# Patient Record
Sex: Female | Born: 1965
Health system: Southern US, Community
[De-identification: ages and names within clinical notes are randomized; demographics above are authoritative.]

## PROBLEM LIST (undated history)

## (undated) DIAGNOSIS — F39 Unspecified mood [affective] disorder: Secondary | ICD-10-CM

## (undated) DIAGNOSIS — E785 Hyperlipidemia, unspecified: Secondary | ICD-10-CM

## (undated) DIAGNOSIS — N189 Chronic kidney disease, unspecified: Secondary | ICD-10-CM

## (undated) DIAGNOSIS — E079 Disorder of thyroid, unspecified: Secondary | ICD-10-CM

## (undated) HISTORY — DX: Chronic kidney disease, unspecified: N18.9

## (undated) HISTORY — DX: Hyperlipidemia, unspecified: E78.5

---

## 2001-09-23 ENCOUNTER — Emergency Department (HOSPITAL_COMMUNITY): Admission: EM | Admit: 2001-09-23 | Discharge: 2001-09-23 | Payer: Self-pay | Admitting: *Deleted

## 2001-09-23 ENCOUNTER — Encounter: Payer: Self-pay | Admitting: *Deleted

## 2011-04-15 ENCOUNTER — Emergency Department (HOSPITAL_COMMUNITY)
Admission: EM | Admit: 2011-04-15 | Discharge: 2011-04-15 | Disposition: A | Discharge status: Home / Self Care | Payer: BC Managed Care – PPO | Attending: Emergency Medicine | Admitting: Emergency Medicine

## 2011-04-15 DIAGNOSIS — E860 Dehydration: Secondary | ICD-10-CM | POA: Insufficient documentation

## 2011-04-15 DIAGNOSIS — E119 Type 2 diabetes mellitus without complications: Secondary | ICD-10-CM | POA: Insufficient documentation

## 2011-04-15 DIAGNOSIS — Z794 Long term (current) use of insulin: Secondary | ICD-10-CM | POA: Insufficient documentation

## 2011-04-15 LAB — CBC
HCT: 35.9 % — ABNORMAL LOW (ref 36.0–46.0)
Hemoglobin: 11.9 g/dL — ABNORMAL LOW (ref 12.0–15.0)
MCH: 26.8 pg (ref 26.0–34.0)
MCHC: 33.1 g/dL (ref 30.0–36.0)
MCV: 80.9 fL (ref 78.0–100.0)
Platelets: 244 10*3/uL (ref 150–400)
RBC: 4.44 MIL/uL (ref 3.87–5.11)
RDW: 13.8 % (ref 11.5–15.5)
WBC: 5.7 10*3/uL (ref 4.0–10.5)

## 2011-04-15 LAB — DIFFERENTIAL
Basophils Absolute: 0 10*3/uL (ref 0.0–0.1)
Basophils Relative: 0 % (ref 0–1)
Eosinophils Absolute: 0 10*3/uL (ref 0.0–0.7)
Eosinophils Relative: 0 % (ref 0–5)
Lymphocytes Relative: 18 % (ref 12–46)
Lymphs Abs: 1 10*3/uL (ref 0.7–4.0)
Monocytes Absolute: 0.3 10*3/uL (ref 0.1–1.0)
Monocytes Relative: 5 % (ref 3–12)
Neutro Abs: 4.3 10*3/uL (ref 1.7–7.7)
Neutrophils Relative %: 76 % (ref 43–77)

## 2011-04-15 LAB — COMPREHENSIVE METABOLIC PANEL
ALT: 16 U/L (ref 0–35)
AST: 15 U/L (ref 0–37)
Albumin: 3.2 g/dL — ABNORMAL LOW (ref 3.5–5.2)
Alkaline Phosphatase: 89 U/L (ref 39–117)
BUN: 14 mg/dL (ref 6–23)
CO2: 25 mEq/L (ref 19–32)
Calcium: 9.5 mg/dL (ref 8.4–10.5)
Chloride: 106 mEq/L (ref 96–112)
Creatinine, Ser: 0.71 mg/dL (ref 0.50–1.10)
GFR calc Af Amer: >60 mL/min (ref >60)
GFR calc non Af Amer: >60 mL/min (ref >60)
Glucose, Bld: 288 mg/dL — ABNORMAL HIGH (ref 70–99)
Potassium: 4.1 mEq/L (ref 3.5–5.1)
Sodium: 138 mEq/L (ref 135–145)
Total Bilirubin: 0.4 mg/dL (ref 0.3–1.2)
Total Protein: 6.2 g/dL (ref 6.0–8.3)

## 2011-04-15 LAB — GLUCOSE, CAPILLARY: Glucose-Capillary: 298 mg/dL — ABNORMAL HIGH (ref 70–99)

## 2012-07-27 ENCOUNTER — Other Ambulatory Visit (HOSPITAL_COMMUNITY)
Admission: RE | Admit: 2012-07-27 | Discharge: 2012-07-27 | Disposition: A | Discharge status: Home / Self Care | Payer: BC Managed Care – PPO | Source: Ambulatory Visit | Attending: Family Medicine | Admitting: Family Medicine

## 2012-07-27 ENCOUNTER — Other Ambulatory Visit: Payer: Self-pay | Admitting: Family Medicine

## 2012-07-27 DIAGNOSIS — Z1151 Encounter for screening for human papillomavirus (HPV): Secondary | ICD-10-CM | POA: Insufficient documentation

## 2012-07-27 DIAGNOSIS — Z124 Encounter for screening for malignant neoplasm of cervix: Secondary | ICD-10-CM | POA: Insufficient documentation

## 2012-07-29 ENCOUNTER — Emergency Department (HOSPITAL_COMMUNITY): Payer: No Typology Code available for payment source

## 2012-07-29 ENCOUNTER — Encounter (HOSPITAL_COMMUNITY): Payer: Self-pay

## 2012-07-29 ENCOUNTER — Emergency Department (HOSPITAL_COMMUNITY)
Admission: EM | Admit: 2012-07-29 | Discharge: 2012-07-29 | Disposition: A | Discharge status: Home / Self Care | Payer: No Typology Code available for payment source | Attending: Emergency Medicine | Admitting: Emergency Medicine

## 2012-07-29 DIAGNOSIS — Z79899 Other long term (current) drug therapy: Secondary | ICD-10-CM | POA: Insufficient documentation

## 2012-07-29 DIAGNOSIS — S9030XA Contusion of unspecified foot, initial encounter: Secondary | ICD-10-CM | POA: Insufficient documentation

## 2012-07-29 DIAGNOSIS — IMO0002 Reserved for concepts with insufficient information to code with codable children: Secondary | ICD-10-CM | POA: Insufficient documentation

## 2012-07-29 DIAGNOSIS — S9032XA Contusion of left foot, initial encounter: Secondary | ICD-10-CM

## 2012-07-29 DIAGNOSIS — E119 Type 2 diabetes mellitus without complications: Secondary | ICD-10-CM | POA: Insufficient documentation

## 2012-07-29 MED ORDER — HYDROCODONE-ACETAMINOPHEN 5-325 MG PO TABS: ORAL_TABLET | ORAL | Status: DC
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
## 2012-07-29 NOTE — ED Provider Notes (Signed)
Medical screening examination/treatment/procedure(s) were performed by non-physician practitioner and as supervising physician I was immediately available for consultation/collaboration.   Benny Lennert, MD 07/29/12 [phone]
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
## 2012-09-23 ENCOUNTER — Emergency Department (HOSPITAL_COMMUNITY)
Admission: EM | Admit: 2012-09-23 | Discharge: 2012-09-23 | Disposition: A | Discharge status: Home / Self Care | Payer: BC Managed Care – PPO | Attending: Emergency Medicine | Admitting: Emergency Medicine

## 2012-09-23 ENCOUNTER — Encounter (HOSPITAL_COMMUNITY): Payer: Self-pay | Admitting: Emergency Medicine

## 2012-09-23 DIAGNOSIS — K5289 Other specified noninfective gastroenteritis and colitis: Secondary | ICD-10-CM | POA: Insufficient documentation

## 2012-09-23 DIAGNOSIS — Z794 Long term (current) use of insulin: Secondary | ICD-10-CM | POA: Insufficient documentation

## 2012-09-23 DIAGNOSIS — E119 Type 2 diabetes mellitus without complications: Secondary | ICD-10-CM | POA: Insufficient documentation

## 2012-09-23 DIAGNOSIS — Z79899 Other long term (current) drug therapy: Secondary | ICD-10-CM | POA: Insufficient documentation

## 2012-09-23 DIAGNOSIS — R197 Diarrhea, unspecified: Secondary | ICD-10-CM | POA: Insufficient documentation

## 2012-09-23 DIAGNOSIS — K529 Noninfective gastroenteritis and colitis, unspecified: Secondary | ICD-10-CM

## 2012-09-23 LAB — GLUCOSE, CAPILLARY: Glucose-Capillary: 187 mg/dL — ABNORMAL HIGH (ref 70–99)

## 2012-09-23 MED ORDER — SODIUM CHLORIDE 0.9 % IV BOLUS (SEPSIS)
1000.0000 mL | Freq: Once | INTRAVENOUS | Status: AC
  Administered 2012-09-23: 1000 mL via INTRAVENOUS

## 2012-09-23 MED ORDER — ONDANSETRON 4 MG PO TBDP: 4.0000 mg | ORAL_TABLET | Freq: Three times a day (TID) | ORAL | Status: DC | PRN

## 2012-09-23 MED ORDER — PROMETHAZINE HCL 25 MG RE SUPP: 25.0000 mg | Freq: Four times a day (QID) | RECTAL | Status: DC | PRN

## 2012-09-23 MED ORDER — ONDANSETRON HCL 4 MG/2ML IJ SOLN
4.0000 mg | Freq: Once | INTRAMUSCULAR | Status: AC
  Administered 2012-09-23: 4 mg via INTRAVENOUS
  Filled 2012-09-23: qty 2

## 2012-09-23 NOTE — ED Notes (Signed)
Patient c/o nausea, vomiting, diarrhea and abdominal cramping x 3 hours.

## 2012-09-23 NOTE — ED Provider Notes (Signed)
History     CSN: [id number]  Arrival date & time 09/23/12  0117   First MD Initiated Contact with Patient 09/23/12 0216      Chief Complaint  Patient presents with  . Nausea  . Emesis  . Diarrhea    (Consider location/radiation/quality/duration/timing/severity/associated sxs/prior treatment) HPI Comments: 46 -year-old female who presents with a complaint of nausea vomiting and diarrhea. She states that her symptoms started this evening, she has had 4 episodes of emesis and 8 episodes of watery nonbloody diarrhea. Her spouse had similar symptoms and I treated him 2 days ago in the emergency department, the symptoms are the same, moderate to severe, nothing makes better or worse. She denies fevers, chills, cough, shortness of breath, chest pain, abdominal pain, back pain.  Patient is a 46 y.o. female presenting with vomiting and diarrhea. The history is provided by the patient.  Emesis  Associated symptoms include diarrhea.  Diarrhea The primary symptoms include vomiting and diarrhea.    Past Medical History  Diagnosis Date  . Diabetes mellitus     Past Surgical History  Procedure Date  . Cesarean section     x2    No family history on file.  History  Substance Use Topics  . Smoking status: Never Smoker   . Smokeless tobacco: Not on file  . Alcohol Use: No    OB History    Grav Para Term Preterm Abortions TAB SAB Ect Mult Living                  Review of Systems  Gastrointestinal: Positive for vomiting and diarrhea.  All other systems reviewed and are negative.    Allergies  Review of patient's allergies indicates no known allergies.  Home Medications   Current Outpatient Rx  Name  Route  Sig  Dispense  Refill  . ATORVASTATIN CALCIUM PO   Oral   Take 1 tablet by mouth daily.         Marland Kitchen CANAGLIFLOZIN 100 MG PO TABS   Oral   Take 3 tablets by mouth daily.          . INSULIN GLARGINE 100 UNIT/ML [place] SOLN   Subcutaneous   Inject 45 Units into  the skin at bedtime.          . TRIAMTERENE-HCTZ 37.5-25 MG PO TABS   Oral   Take 1 tablet by mouth daily as needed. For swelling         . HYDROCODONE-ACETAMINOPHEN 5-325 MG PO TABS      1 or 2 po q4h prn pain   15 tablet   0   . ONDANSETRON 4 MG PO TBDP   Oral   Take 1 tablet (4 mg total) by mouth every 8 (eight) hours as needed for nausea.   10 tablet   0   . PROMETHAZINE HCL 25 MG RE SUPP   Rectal   Place 1 suppository (25 mg total) rectally every 6 (six) hours as needed for nausea.   12 each   0   . SAXAGLIPTIN-METFORMIN ER 02-999 MG PO TB24   Oral   Take 1 tablet by mouth daily.           BP 103/54  Pulse 113  Temp 99.8 F (37.7 C) (Oral)  Resp 18  Ht 5\' 7"  (1.702 m)  Wt 230 lb (104.327 kg)  BMI 36.02 kg/m2  SpO2 96%  Physical Exam  Nursing note and vitals reviewed. Constitutional: She appears well-developed and  well-nourished. No distress.  HENT:  Head: Normocephalic and atraumatic.  Mouth/Throat: Oropharynx is clear and moist. No oropharyngeal exudate.  Eyes: Conjunctivae normal and EOM are normal. Pupils are equal, round, and reactive to light. Right eye exhibits no discharge. Left eye exhibits no discharge. No scleral icterus.  Neck: Normal range of motion. Neck supple. No JVD present. No thyromegaly present.  Cardiovascular: Regular rhythm, normal heart sounds and intact distal pulses.  Exam reveals no gallop and no friction rub.   No murmur heard.      Mild tachycardia  Pulmonary/Chest: Effort normal and breath sounds normal. No respiratory distress. She has no wheezes. She has no rales.  Abdominal: Soft. She exhibits no distension and no mass. There is no tenderness.       Increased bowel sounds, nontender abdomen, obese  Musculoskeletal: Normal range of motion. She exhibits no edema and no tenderness.  Lymphadenopathy:    She has no cervical adenopathy.  Neurological: She is alert. Coordination normal.  Skin: Skin is warm and dry. No rash  noted. No erythema.  Psychiatric: She has a normal mood and affect. Her behavior is normal.    ED Course  Procedures (including critical care time)  Labs Reviewed  GLUCOSE, CAPILLARY - Abnormal; Notable for the following:    Glucose-Capillary 187 (*)     All other components within normal limits   No results found.   1. Gastroenteritis       MDM  The patient does appear to be mildly dehydrated, has a mild tachycardia, low-grade fever at 99.8. She has a spouse with similar symptoms over the last week thus I suspect that she has the related infection. She has opted for IV fluid rehydration and intravenous parenteral antiemetics. I feel this is appropriate therapy at this time, we'll check a CBG, patient appears stable overall without hypotension, hypoxia or any respiratory distress.  Symptoms significantly improved after 2 L of IV fluids and Zofran, patient stable for discharge.     Vida Roller, MD 09/23/12 [phone]

## 2013-09-06 ENCOUNTER — Encounter (HOSPITAL_COMMUNITY): Payer: Self-pay | Admitting: Emergency Medicine

## 2013-09-06 ENCOUNTER — Emergency Department (HOSPITAL_COMMUNITY)
Admission: EM | Admit: 2013-09-06 | Discharge: 2013-09-06 | Disposition: A | Discharge status: Home / Self Care | Payer: BC Managed Care – PPO | Attending: Emergency Medicine | Admitting: Emergency Medicine

## 2013-09-06 DIAGNOSIS — Z8659 Personal history of other mental and behavioral disorders: Secondary | ICD-10-CM | POA: Insufficient documentation

## 2013-09-06 DIAGNOSIS — E1169 Type 2 diabetes mellitus with other specified complication: Secondary | ICD-10-CM | POA: Insufficient documentation

## 2013-09-06 DIAGNOSIS — E162 Hypoglycemia, unspecified: Secondary | ICD-10-CM

## 2013-09-06 DIAGNOSIS — Z794 Long term (current) use of insulin: Secondary | ICD-10-CM | POA: Insufficient documentation

## 2013-09-06 DIAGNOSIS — Z79899 Other long term (current) drug therapy: Secondary | ICD-10-CM | POA: Insufficient documentation

## 2013-09-06 DIAGNOSIS — E876 Hypokalemia: Secondary | ICD-10-CM | POA: Insufficient documentation

## 2013-09-06 DIAGNOSIS — E079 Disorder of thyroid, unspecified: Secondary | ICD-10-CM | POA: Insufficient documentation

## 2013-09-06 HISTORY — DX: Unspecified mood (affective) disorder: F39

## 2013-09-06 HISTORY — DX: Disorder of thyroid, unspecified: E07.9

## 2013-09-06 LAB — URINALYSIS, ROUTINE W REFLEX MICROSCOPIC
Bilirubin Urine: NEGATIVE
Ketones, ur: NEGATIVE mg/dL
Nitrite: NEGATIVE
Protein, ur: NEGATIVE mg/dL
Specific Gravity, Urine: >1.03 — ABNORMAL HIGH (ref 1.005–1.030)
Urobilinogen, UA: 0.2 mg/dL (ref 0.0–1.0)

## 2013-09-06 LAB — CBC WITH DIFFERENTIAL/PLATELET
Basophils Absolute: 0 10*3/uL (ref 0.0–0.1)
Basophils Relative: 0 % (ref 0–1)
Eosinophils Absolute: 0.1 10*3/uL (ref 0.0–0.7)
MCHC: 32.7 g/dL (ref 30.0–36.0)
Monocytes Absolute: 0.5 10*3/uL (ref 0.1–1.0)
Neutro Abs: 5.1 10*3/uL (ref 1.7–7.7)
Neutrophils Relative %: 68 % (ref 43–77)
RDW: 13.5 % (ref 11.5–15.5)

## 2013-09-06 LAB — BASIC METABOLIC PANEL
BUN: 23 mg/dL (ref 6–23)
Creatinine, Ser: 1.24 mg/dL — ABNORMAL HIGH (ref 0.50–1.10)
GFR calc Af Amer: 59 mL/min — ABNORMAL LOW (ref >90)
GFR calc non Af Amer: 51 mL/min — ABNORMAL LOW (ref >90)

## 2013-09-06 LAB — URINE MICROSCOPIC-ADD ON

## 2013-09-06 LAB — GLUCOSE, CAPILLARY: Glucose-Capillary: 155 mg/dL — ABNORMAL HIGH (ref 70–99)

## 2013-09-06 MED ORDER — POTASSIUM CHLORIDE ER 10 MEQ PO TBCR: 10.0000 meq | EXTENDED_RELEASE_TABLET | Freq: Every day | ORAL | Status: DC

## 2013-09-06 MED ORDER — SODIUM CHLORIDE 0.9 % IV SOLN
Freq: Once | INTRAVENOUS | Status: AC
  Administered 2013-09-06: 20 mL/h via INTRAVENOUS

## 2013-09-06 NOTE — ED Notes (Signed)
Patient presents to ER via [facility] with c/o hypoglycemia.  Per EMS, CBG was 32 and patient was unresponsive.  EMS gave 1 amp D50 and CBG was 164 en route.

## 2013-09-06 NOTE — ED Provider Notes (Signed)
CSN: [id number]     Arrival date & time 09/06/13  0306 History   First MD Initiated Contact with Patient 09/06/13 0309     Chief Complaint  Patient presents with  . Hypoglycemia   (Consider location/radiation/quality/duration/timing/severity/associated sxs/prior Treatment) HPI Comments: Pt is a diabetic with hx of using oral and SQ meds - took her 70/30 last night (32 units) and went to bed - she had no c/o last night and has been eating normal meals without vomiting, nausea, pain, dysuria, diarrhea.  She was found by family unresponsive at home - EMS was called and found her CBG to be around 30 - D50 was given and her MS improved with her CBG.  She is normal MS at arrival.  She again states that nothing bother her at this time - no c/o.  Patient is a 47 y.o. female presenting with hypoglycemia. The history is provided by the patient and the EMS personnel.  Hypoglycemia   Past Medical History  Diagnosis Date  . Diabetes mellitus   . Thyroid disease   . Mood disorder    Past Surgical History  Procedure Laterality Date  . Cesarean section      x2   No family history on file. History  Substance Use Topics  . Smoking status: Never Smoker   . Smokeless tobacco: Not on file  . Alcohol Use: No   OB History   Grav Para Term Preterm Abortions TAB SAB Ect Mult Living                 Review of Systems  All other systems reviewed and are negative.    Allergies  Review of patient's allergies indicates no known allergies.  Home Medications   Current Outpatient Rx  Name  Route  Sig  Dispense  Refill  . ATORVASTATIN CALCIUM PO   Oral   Take 1 tablet by mouth daily.         Marland Kitchen buPROPion (WELLBUTRIN XL) 150 MG 24 hr tablet   Oral   Take 150 mg by mouth daily.         . Canagliflozin (INVOKANA) 100 MG TABS   Oral   Take 1 tablet by mouth daily.          . furosemide (LASIX) 20 MG tablet   Oral   Take 20 mg by mouth daily.         . insulin aspart protamine-  aspart (NOVOLOG MIX 70/30) (70-30) 100 UNIT/ML injection   Subcutaneous   Inject 26 Units into the skin 2 (two) times daily with a meal.         . levothyroxine (SYNTHROID, LEVOTHROID) 50 MCG tablet   Oral   Take 50 mcg by mouth daily before breakfast.         . Liraglutide (VICTOZA) 18 MG/3ML SOPN   Subcutaneous   Inject 1.8 mg into the skin daily.         Marland Kitchen triamterene-hydrochlorothiazide (MAXZIDE-25) 37.5-25 MG per tablet   Oral   Take 1 tablet by mouth daily as needed. For swelling         . HYDROcodone-acetaminophen (NORCO) 5-325 MG per tablet      1 or 2 po q4h prn pain   15 tablet   0   . insulin glargine (LANTUS) 100 UNIT/ML injection   Subcutaneous   Inject 45 Units into the skin at bedtime.          . ondansetron (ZOFRAN ODT) 4 MG  disintegrating tablet   Oral   Take 1 tablet (4 mg total) by mouth every 8 (eight) hours as needed for nausea.   10 tablet   0   . potassium chloride (K-DUR) 10 MEQ tablet   Oral   Take 1 tablet (10 mEq total) by mouth daily.   10 tablet   0   . promethazine (PHENERGAN) 25 MG suppository   Rectal   Place 1 suppository (25 mg total) rectally every 6 (six) hours as needed for nausea.   12 each   0   . Saxagliptin-Metformin (KOMBIGLYZE XR) 02-999 MG TB24   Oral   Take 1 tablet by mouth daily.          BP 121/66  Pulse 88  Temp(Src) 98.1 F (36.7 C) (Oral)  Resp 14  Ht 5\' 7"  (1.702 m)  Wt 236 lb (107.049 kg)  BMI 36.95 kg/m2  SpO2 99% Physical Exam  Nursing note and vitals reviewed. Constitutional: She appears well-developed and well-nourished. No distress.  HENT:  Head: Normocephalic and atraumatic.  Mouth/Throat: Oropharynx is clear and moist. No oropharyngeal exudate.  Eyes: Conjunctivae and EOM are normal. Pupils are equal, round, and reactive to light. Right eye exhibits no discharge. Left eye exhibits no discharge. No scleral icterus.  Neck: Normal range of motion. Neck supple. No JVD present. No  thyromegaly present.  Cardiovascular: Normal rate, regular rhythm, normal heart sounds and intact distal pulses.  Exam reveals no gallop and no friction rub.   No murmur heard. Pulmonary/Chest: Effort normal and breath sounds normal. No respiratory distress. She has no wheezes. She has no rales.  Abdominal: Soft. Bowel sounds are normal. She exhibits no distension and no mass. There is no tenderness.  Musculoskeletal: Normal range of motion. She exhibits no edema and no tenderness.  Lymphadenopathy:    She has no cervical adenopathy.  Neurological: She is alert. Coordination normal.  Normal speech, coordination, strength, CN 3-12 and memory  Skin: Skin is warm and dry. No rash noted. No erythema.  Psychiatric: She has a normal mood and affect. Her behavior is normal.    ED Course  Procedures (including critical care time) Labs Review Labs Reviewed  URINALYSIS, ROUTINE W REFLEX MICROSCOPIC - Abnormal; Notable for the following:    Specific Gravity, Urine >1.030 (*)    Glucose, UA >1000 (*)    All other components within normal limits  BASIC METABOLIC PANEL - Abnormal; Notable for the following:    Potassium 3.0 (*)    Glucose, Bld 64 (*)    Creatinine, Ser 1.24 (*)    GFR calc non Af Amer 51 (*)    GFR calc Af Amer 59 (*)    All other components within normal limits  GLUCOSE, CAPILLARY - Abnormal; Notable for the following:    Glucose-Capillary 125 (*)    All other components within normal limits  URINE MICROSCOPIC-ADD ON - Abnormal; Notable for the following:    Squamous Epithelial / LPF MANY (*)    All other components within normal limits  GLUCOSE, CAPILLARY - Abnormal; Notable for the following:    Glucose-Capillary 155 (*)    All other components within normal limits  GLUCOSE, CAPILLARY - Abnormal; Notable for the following:    Glucose-Capillary 131 (*)    All other components within normal limits  GLUCOSE, CAPILLARY  CBC WITH DIFFERENTIAL   Imaging Review No results  found.  EKG Interpretation   None       MDM  1. Hypoglycemia   2. Hypokalemia      The pt is well appearing, VS are normal, feed, recheck CBG, basic labs.  Pt has beeen well since arrival - has not had recurrent drop in cbg after eating - no abnormal findings on labs other than hypokalemia.  Meds given in ED:  Medications  0.9 %  sodium chloride infusion (20 mL/hr Intravenous New Bag/Given 09/06/13 0357)    New Prescriptions   POTASSIUM CHLORIDE (K-DUR) 10 MEQ TABLET    Take 1 tablet (10 mEq total) by mouth daily.      Vida Roller, MD 09/06/13 [phone]

## 2013-09-06 NOTE — ED Notes (Signed)
Ambulatory to bathroom for BM- states her stomach is rolling.  Has had 2 TBS of peanut butter, crackers and the [facility] gave her a slice of birthday cake

## 2013-09-11 ENCOUNTER — Emergency Department (HOSPITAL_BASED_OUTPATIENT_CLINIC_OR_DEPARTMENT_OTHER)
Admission: EM | Admit: 2013-09-11 | Discharge: 2013-09-11 | Disposition: A | Discharge status: Home / Self Care | Payer: BC Managed Care – PPO | Attending: Emergency Medicine | Admitting: Emergency Medicine

## 2013-09-11 ENCOUNTER — Encounter (HOSPITAL_BASED_OUTPATIENT_CLINIC_OR_DEPARTMENT_OTHER): Payer: Self-pay | Admitting: Emergency Medicine

## 2013-09-11 DIAGNOSIS — Z79899 Other long term (current) drug therapy: Secondary | ICD-10-CM | POA: Insufficient documentation

## 2013-09-11 DIAGNOSIS — E079 Disorder of thyroid, unspecified: Secondary | ICD-10-CM | POA: Insufficient documentation

## 2013-09-11 DIAGNOSIS — Z794 Long term (current) use of insulin: Secondary | ICD-10-CM | POA: Insufficient documentation

## 2013-09-11 DIAGNOSIS — E1169 Type 2 diabetes mellitus with other specified complication: Secondary | ICD-10-CM | POA: Insufficient documentation

## 2013-09-11 DIAGNOSIS — Z8659 Personal history of other mental and behavioral disorders: Secondary | ICD-10-CM | POA: Insufficient documentation

## 2013-09-11 DIAGNOSIS — E162 Hypoglycemia, unspecified: Secondary | ICD-10-CM

## 2013-09-11 LAB — GLUCOSE, CAPILLARY: Glucose-Capillary: 99 mg/dL (ref 70–99)

## 2013-09-11 NOTE — ED Provider Notes (Signed)
CSN: [id number]     Arrival date & time 09/11/13  1122 History   First MD Initiated Contact with Patient 09/11/13 1208     Chief Complaint  Patient presents with  . Hypoglycemia   (Consider location/radiation/quality/duration/timing/severity/associated sxs/prior Treatment) Patient is a 47 y.o. female presenting with hypoglycemia.  Hypoglycemia Associated symptoms: no shortness of breath     47 year old female here after hypoglycemic episode this morning. She describes that she went to work as usual, took a reduced dose of her insulin (26 units) and went back to work is normal. 30-60 minutes later she began having scotoma, sweats, shaking, chills, and CBG which was found to be 75 at that time. She had some M&Ms and rechecked her blood sugar 15 minutes later which was stable at 75. States that she became very emotional and so she was sent home by her boss. She checked again and it was 63. Her husband called her doctor's office who advised her to come straight to the ER. She reports normal PO intake lately and is tolerating fluids normally.   Explains that one week ago he was found unresponsive and had a CBG of 30. She's been on this dose of insulin and invokana for approximately 6 weeks. She cut her 70/30 dose from 36 BID to 26 units am and 18 units evening.   Past Medical History  Diagnosis Date  . Diabetes mellitus   . Thyroid disease   . Mood disorder    Past Surgical History  Procedure Laterality Date  . Cesarean section      x2   No family history on file. History  Substance Use Topics  . Smoking status: Never Smoker   . Smokeless tobacco: Not on file  . Alcohol Use: No   OB History   Grav Para Term Preterm Abortions TAB SAB Ect Mult Living                 Review of Systems  Constitutional: Negative for fever, chills and appetite change.  HENT: Negative for sore throat.   Respiratory: Negative for cough and shortness of breath.   Cardiovascular: Negative for chest pain.   Gastrointestinal: Negative for abdominal pain.  Genitourinary: Negative for dysuria.  Musculoskeletal: Negative for myalgias.  Neurological: Negative for headaches.  All other systems reviewed and are negative.    Allergies  Review of patient's allergies indicates no known allergies.  Home Medications   Current Outpatient Rx  Name  Route  Sig  Dispense  Refill  . ATORVASTATIN CALCIUM PO   Oral   Take 1 tablet by mouth daily.         Marland Kitchen buPROPion (WELLBUTRIN XL) 150 MG 24 hr tablet   Oral   Take 150 mg by mouth daily.         . Canagliflozin (INVOKANA) 100 MG TABS   Oral   Take 1 tablet by mouth daily.          . furosemide (LASIX) 20 MG tablet   Oral   Take 20 mg by mouth daily.         Marland Kitchen HYDROcodone-acetaminophen (NORCO) 5-325 MG per tablet      1 or 2 po q4h prn pain   15 tablet   0   . insulin aspart protamine- aspart (NOVOLOG MIX 70/30) (70-30) 100 UNIT/ML injection   Subcutaneous   Inject 26 Units into the skin 2 (two) times daily with a meal.         .  insulin glargine (LANTUS) 100 UNIT/ML injection   Subcutaneous   Inject 45 Units into the skin at bedtime.          Marland Kitchen levothyroxine (SYNTHROID, LEVOTHROID) 50 MCG tablet   Oral   Take 50 mcg by mouth daily before breakfast.         . Liraglutide (VICTOZA) 18 MG/3ML SOPN   Subcutaneous   Inject 1.8 mg into the skin daily.         . ondansetron (ZOFRAN ODT) 4 MG disintegrating tablet   Oral   Take 1 tablet (4 mg total) by mouth every 8 (eight) hours as needed for nausea.   10 tablet   0   . potassium chloride (K-DUR) 10 MEQ tablet   Oral   Take 1 tablet (10 mEq total) by mouth daily.   10 tablet   0   . promethazine (PHENERGAN) 25 MG suppository   Rectal   Place 1 suppository (25 mg total) rectally every 6 (six) hours as needed for nausea.   12 each   0   . Saxagliptin-Metformin (KOMBIGLYZE XR) 02-999 MG TB24   Oral   Take 1 tablet by mouth daily.         Marland Kitchen  triamterene-hydrochlorothiazide (MAXZIDE-25) 37.5-25 MG per tablet   Oral   Take 1 tablet by mouth daily as needed. For swelling          BP 114/79  Pulse 88  Temp(Src) 98 F (36.7 C) (Oral)  Resp 18  Ht 5\' 7"  (1.702 m)  Wt 222 lb (100.699 kg)  BMI 34.76 kg/m2  SpO2 100% Physical Exam  Nursing note and vitals reviewed. Constitutional: She is oriented to person, place, and time. She appears well-developed and well-nourished. No distress.  HENT:  Head: Normocephalic and atraumatic.  Eyes: EOM are normal. Pupils are equal, round, and reactive to light.  Neck: Neck supple.  Cardiovascular: Normal rate, regular rhythm and normal heart sounds.   No murmur heard. Pulmonary/Chest: Effort normal and breath sounds normal.  Abdominal: Soft. Bowel sounds are normal. There is no tenderness. There is no guarding.  Musculoskeletal: She exhibits no edema.  Neurological: She is alert and oriented to person, place, and time.  Skin: Skin is warm and dry. She is not diaphoretic.  Psychiatric: She has a normal mood and affect.    ED Course  Procedures (including critical care time) Labs Review Labs Reviewed  GLUCOSE, CAPILLARY   Imaging Review No results found.  EKG Interpretation   None       MDM   1. Hypoglycemia    47 y/o female here with mild hypoglycemic episode this am. She had an episode last week where she was found unresponsive and has been working closely with her endocrinologist since then to reduce her dose. She now feels well and her CBG is 99.   I discussed her case with Dr. Sharl Ma, her endocrinologist, Who recommended decreasing her 70/30 dose to 20 in the am and following up in the coming days.   Discussed red flags and that there may be an adjustment period with better glucose control where eventually 70s, low normal CBG, will likely npt feel like hypoglycemia.       Elenora Gamma, MD 09/11/13 1250

## 2013-09-11 NOTE — ED Provider Notes (Signed)
I saw and evaluated the patient, reviewed the resident's note and I agree with the findings and plan.  EKG Interpretation   None       Pt with borderline hypoglycemia at work, improved after eating. Advised by someone in Endo office to come to the ED. Asymptomatic now.   Charles B. Bernette Mayers, MD 09/11/13 [phone]

## 2013-09-11 NOTE — ED Notes (Signed)
EMS reports patient has diabetes and has had some recent low levels.  Has her medication adjusted yesterday by her PCP.  Two hour pta, pt felt shaky and checked her cbg and it was 70.  Ate and felt better, now cbg is 111.  States her PCP Dr. Napoleon Form told her to come to the ed today.

## 2014-09-30 ENCOUNTER — Emergency Department (HOSPITAL_BASED_OUTPATIENT_CLINIC_OR_DEPARTMENT_OTHER)
Admission: EM | Admit: 2014-09-30 | Discharge: 2014-10-01 | Disposition: A | Discharge status: Home / Self Care | Payer: BC Managed Care – PPO | Attending: Emergency Medicine | Admitting: Emergency Medicine

## 2014-09-30 ENCOUNTER — Emergency Department (HOSPITAL_BASED_OUTPATIENT_CLINIC_OR_DEPARTMENT_OTHER): Payer: BC Managed Care – PPO

## 2014-09-30 ENCOUNTER — Encounter (HOSPITAL_BASED_OUTPATIENT_CLINIC_OR_DEPARTMENT_OTHER): Payer: Self-pay | Admitting: Emergency Medicine

## 2014-09-30 DIAGNOSIS — Y998 Other external cause status: Secondary | ICD-10-CM | POA: Diagnosis not present

## 2014-09-30 DIAGNOSIS — M722 Plantar fascial fibromatosis: Secondary | ICD-10-CM | POA: Insufficient documentation

## 2014-09-30 DIAGNOSIS — X58XXXA Exposure to other specified factors, initial encounter: Secondary | ICD-10-CM | POA: Insufficient documentation

## 2014-09-30 DIAGNOSIS — S99921A Unspecified injury of right foot, initial encounter: Secondary | ICD-10-CM | POA: Diagnosis present

## 2014-09-30 DIAGNOSIS — Z8659 Personal history of other mental and behavioral disorders: Secondary | ICD-10-CM | POA: Insufficient documentation

## 2014-09-30 DIAGNOSIS — Y9389 Activity, other specified: Secondary | ICD-10-CM | POA: Diagnosis not present

## 2014-09-30 DIAGNOSIS — M79673 Pain in unspecified foot: Secondary | ICD-10-CM

## 2014-09-30 DIAGNOSIS — E079 Disorder of thyroid, unspecified: Secondary | ICD-10-CM | POA: Diagnosis not present

## 2014-09-30 DIAGNOSIS — Y9301 Activity, walking, marching and hiking: Secondary | ICD-10-CM | POA: Diagnosis not present

## 2014-09-30 DIAGNOSIS — Z794 Long term (current) use of insulin: Secondary | ICD-10-CM | POA: Diagnosis not present

## 2014-09-30 DIAGNOSIS — E119 Type 2 diabetes mellitus without complications: Secondary | ICD-10-CM | POA: Insufficient documentation

## 2014-09-30 DIAGNOSIS — S96911A Strain of unspecified muscle and tendon at ankle and foot level, right foot, initial encounter: Secondary | ICD-10-CM | POA: Insufficient documentation

## 2014-09-30 DIAGNOSIS — Y9289 Other specified places as the place of occurrence of the external cause: Secondary | ICD-10-CM | POA: Diagnosis not present

## 2014-09-30 DIAGNOSIS — Z79899 Other long term (current) drug therapy: Secondary | ICD-10-CM | POA: Diagnosis not present

## 2014-09-30 NOTE — ED Notes (Signed)
Pt states that she was walking and hurt her foot

## 2014-09-30 NOTE — Discharge Instructions (Signed)
Foot Sprain The muscles and cord like structures which attach muscle to bone (tendons) that surround the feet are made up of units. A foot sprain can occur at the weakest spot in any of these units. This condition is most often caused by injury to or overuse of the foot, as from playing contact sports, or aggravating a previous injury, or from poor conditioning, or obesity. SYMPTOMS  Pain with movement of the foot.  Tenderness and swelling at the injury site.  Loss of strength is present in moderate or severe sprains. THE THREE GRADES OR SEVERITY OF FOOT SPRAIN ARE:  Mild (Grade I): Slightly pulled muscle without tearing of muscle or tendon fibers or loss of strength.  Moderate (Grade II): Tearing of fibers in a muscle, tendon, or at the attachment to bone, with small decrease in strength.  Severe (Grade III): Rupture of the muscle-tendon-bone attachment, with separation of fibers. Severe sprain requires surgical repair. Often repeating (chronic) sprains are caused by overuse. Sudden (acute) sprains are caused by direct injury or over-use. DIAGNOSIS  Diagnosis of this condition is usually by your own observation. If problems continue, a caregiver may be required for further evaluation and treatment. X-rays may be required to make sure there are not breaks in the bones (fractures) present. Continued problems may require physical therapy for treatment. PREVENTION  Use strength and conditioning exercises appropriate for your sport.  Warm up properly prior to working out.  Use athletic shoes that are made for the sport you are participating in.  Allow adequate time for healing. Early return to activities makes repeat injury more likely, and can lead to an unstable arthritic foot that can result in prolonged disability. Mild sprains generally heal in 3 to 10 days, with moderate and severe sprains taking 2 to 10 weeks. Your caregiver can help you determine the proper time required for  healing. HOME CARE INSTRUCTIONS   Apply ice to the injury for 15-20 minutes, 03-04 times per day. Put the ice in a plastic bag and place a towel between the bag of ice and your skin.  An elastic wrap (like an Ace bandage) may be used to keep swelling down.  Keep foot above the level of the heart, or at least raised on a footstool, when swelling and pain are present.  Try to avoid use other than gentle range of motion while the foot is painful. Do not resume use until instructed by your caregiver. Then begin use gradually, not increasing use to the point of pain. If pain does develop, decrease use and continue the above measures, gradually increasing activities that do not cause discomfort, until you gradually achieve normal use.  Use crutches if and as instructed, and for the length of time instructed.  Keep injured foot and ankle wrapped between treatments.  Massage foot and ankle for comfort and to keep swelling down. Massage from the toes up towards the knee.  Only take over-the-counter or prescription medicines for pain, discomfort, or fever as directed by your caregiver. SEEK IMMEDIATE MEDICAL CARE IF:   Your pain and swelling increase, or pain is not controlled with medications.  You have loss of feeling in your foot or your foot turns cold or blue.  You develop new, unexplained symptoms, or an increase of the symptoms that brought you to your caregiver. MAKE SURE YOU:   Understand these instructions.  Will watch your condition.  Will get help right away if you are not doing well or get worse. Document Released:  04/02/2002 Document Revised: 01/03/2012 Document Reviewed: 05/30/2008 ExitCare Patient Information 2015 [place] [place]. This information is not intended to replace advice given to you by your health care provider. Make sure you discuss any questions you have with your health care provider.  Heel Spur A heel spur is a hook of bone that can form on the calcaneus (the  heel bone and the largest bone of the foot). Heel spurs are often associated with plantar fasciitis and usually come in people who have had the problem for an extended period of time. The cause of the relationship is unknown. The pain associated with them is thought to be caused by an inflammation (soreness and redness) of the plantar fascia rather than the spur itself. The plantar fascia is a thick fibrous like tissue that runs from the calcaneus (heel bone) to the ball of the foot. This strong, tight tissue helps maintain the arch of your foot. It helps distribute the weight across your foot as you walk or run. Stresses placed on the plantar fascia can be tremendous. When it is inflamed normal activities become painful. Pain is worse in the morning after sleeping. After sleeping the plantar fascia is tight. The first movements stretch the fascia and this causes pain. As the tendon loosens, the pain usually gets better. It often returns with too much standing or walking.  About 70% of patients with plantar fasciitis have a heel spur. About half of people without foot pain also have heel spurs. DIAGNOSIS  The diagnosis of a heel spur is made by X-ray. The X-ray shows a hook of bone protruding from the bottom of the calcaneus at the point where the plantar fascia is attached to the heel bone.  TREATMENT  It is necessary to find out what is causing the stretching of the plantar fascia. If the cause is over-pronation (flat feet), orthotics and proper foot ware may help.  Stretching exercises, losing weight, wearing shoes that have a cushioned heel that absorbs shock, and elevating the heel with the use of a heel cradle, heel cup, or orthotics may all help. Heel cradles and heel cups provide extra comfort and cushion to the heel, and reduce the amount of shock to the sore area. AVOIDING THE PAIN OF PLANTAR FASCIITIS AND HEEL SPURS  Consult a sports medicine professional before beginning a new exercise  program.  Walking programs offer a good workout. There is a lower chance of overuse injuries common to the runners. There is less impact and less jarring of the joints.  Begin all new exercise programs slowly. If problems or pains develop, decrease the amount of time or distance until you are at a comfortable level.  Wear good shoes and replace them regularly.  Stretch your foot and the heel cords at the back of the ankle (Achilles tendons) both before and after exercise.  Run or exercise on even surfaces that are not hard. For example, asphalt is better than pavement.  Do not run barefoot on hard surfaces.  If using a treadmill, vary the incline.  Do not continue to workout if you have foot or joint problems. Seek professional help if they do not improve. HOME CARE INSTRUCTIONS   Avoid activities that cause you pain until you recover.  Use ice or cold packs to the problem or painful areas after working out.  Only take over-the-counter or prescription medicines for pain, discomfort, or fever as directed by your caregiver.  Soft shoe inserts or athletic shoes with air or gel  sole cushions may be helpful.  If problems continue or become more severe, consult a sports medicine caregiver. Cortisone is a potent anti-inflammatory medication that may be injected into the painful area. You can discuss this treatment with your caregiver. MAKE SURE YOU:   Understand these instructions.  Will watch your condition.  Will get help right away if you are not doing well or get worse. Document Released: 11/17/2005 Document Revised: 01/03/2012 Document Reviewed: 12/12/2013 [facility] [place] Patient Information 2015 [place], [place]. This information is not intended to replace advice given to you by your health care provider. Make sure you discuss any questions you have with your health care provider.

## 2014-10-01 NOTE — ED Provider Notes (Signed)
CSN: [id number]     Arrival date & time 09/30/14  2131 History   First MD Initiated Contact with Patient 09/30/14 2322     Chief Complaint  Patient presents with  . Foot Pain     (Consider location/radiation/quality/duration/timing/severity/associated sxs/prior Treatment) HPI Comments: Pt c/o right foot pain that started today when walking. States that it felt like something popped. Pt states that it hurts to walk on it. Denies numbness or weakness. Pt states that she has no previous injury to the foot. States that the area is swelling. Has not had any redness to the area.  The history is provided by the patient. No language interpreter was used.    Past Medical History  Diagnosis Date  . Diabetes mellitus   . Thyroid disease   . Mood disorder    Past Surgical History  Procedure Laterality Date  . Cesarean section      x2   History reviewed. No pertinent family history. History  Substance Use Topics  . Smoking status: Never Smoker   . Smokeless tobacco: Not on file  . Alcohol Use: No   OB History    No data available     Review of Systems  All other systems reviewed and are negative.     Allergies  Review of patient's allergies indicates no known allergies.  Home Medications   Prior to Admission medications   Medication Sig Start Date End Date Taking? Authorizing Provider  ATORVASTATIN CALCIUM PO Take 1 tablet by mouth daily.    Historical Provider, MD  buPROPion (WELLBUTRIN XL) 150 MG 24 hr tablet Take 150 mg by mouth daily.    Historical Provider, MD  Canagliflozin (INVOKANA) 100 MG TABS Take 1 tablet by mouth daily.     Historical Provider, MD  furosemide (LASIX) 20 MG tablet Take 20 mg by mouth daily.    Historical Provider, MD  HYDROcodone-acetaminophen [facility](NORCO) 5-325 MG per tablet 1 or 2 po q4h prn pain 07/29/12   Kathie DikeHobson M Bryant, PA-C  insulin aspart protamine- aspart (NOVOLOG MIX 70/30) (70-30) 100 UNIT/ML injection Inject 26 Units into the skin 2 (two)  times daily with a meal.    Historical Provider, MD  insulin glargine (LANTUS) 100 UNIT/ML injection Inject 45 Units into the skin at bedtime.     Historical Provider, MD  levothyroxine (SYNTHROID, LEVOTHROID) 50 MCG tablet Take 50 mcg by mouth daily before breakfast.    Historical Provider, MD  Liraglutide (VICTOZA) 18 MG/3ML SOPN Inject 1.8 mg into the skin daily.    Historical Provider, MD  ondansetron (ZOFRAN ODT) 4 MG disintegrating tablet Take 1 tablet (4 mg total) by mouth every 8 (eight) hours as needed for nausea. 09/23/12   Vida RollerBrian D Miller, MD  potassium chloride (K-DUR) 10 MEQ tablet Take 1 tablet (10 mEq total) by mouth daily. 09/06/13   Vida RollerBrian D Miller, MD  promethazine (PHENERGAN) 25 MG suppository Place 1 suppository (25 mg total) rectally every 6 (six) hours as needed for nausea. 09/23/12   Vida RollerBrian D Miller, MD  Saxagliptin-Metformin (KOMBIGLYZE XR) 02-999 MG TB24 Take 1 tablet by mouth daily.    Historical Provider, MD  triamterene-hydrochlorothiazide (MAXZIDE-25) 37.5-25 MG per tablet Take 1 tablet by mouth daily as needed. For swelling    Historical Provider, MD   BP 136/66 mmHg  Pulse 87  Temp(Src) 98.3 F (36.8 C) (Oral)  Resp 18  Ht 5\' 7"  (1.702 m)  Wt 220 lb (99.791 kg)  BMI 34.45 kg/m2  SpO2 99%  LMP 12/28/2011 Physical Exam  Constitutional: She appears well-developed and well-nourished.  Cardiovascular: Normal rate and regular rhythm.   Pulmonary/Chest: Effort normal and breath sounds normal.  Musculoskeletal:  Swelling noted to foot and ankle. Tenderness to arch and heel of foot. Pulses intact. Pt has full flexion and extension  Nursing note and vitals reviewed.   ED Course  Procedures (including critical care time) Labs Review Labs Reviewed - No data to display  Imaging Review Dg Foot Complete Right  09/30/2014   CLINICAL DATA:  While walking today. the patient felt 2 separate pops in top of foot (talus area). The patient stated that the pain travelled to  back of the foot/ankle area. She is unable to put any weight on the right foot.  EXAM: RIGHT FOOT COMPLETE - 3+ VIEW  COMPARISON:  None.  FINDINGS: No fracture. Joints are normally aligned. Moderate-sized plantar calcaneal spur. Mild nonspecific soft tissue edema most evident in the mid foot and ankle.  IMPRESSION: No fracture or dislocation.   Electronically Signed   By: Amie Portlandavid  Ormond M.D.   On: 09/30/2014 22:14     EKG Interpretation None      MDM   Final diagnoses:  Right foot strain, initial encounter  Plantar fasciitis    Pt wrapped for comfort and given crutches for as needed although pt ambulates fine without. Given follow up with Dr. Vivi BarrackHudnall   Tansy Lorek, NP 10/01/14 0022  Warnell Foresterrey Wofford, MD 10/01/14 [phone]

## 2014-10-08 ENCOUNTER — Ambulatory Visit (INDEPENDENT_AMBULATORY_CARE_PROVIDER_SITE_OTHER): Payer: BC Managed Care – PPO | Admitting: Family Medicine

## 2014-10-08 ENCOUNTER — Encounter: Payer: Self-pay | Admitting: Family Medicine

## 2014-10-08 VITALS — BP 107/75 | HR 86 | Ht 67.0 in | Wt 220.0 lb

## 2014-10-08 DIAGNOSIS — M25571 Pain in right ankle and joints of right foot: Secondary | ICD-10-CM

## 2014-10-08 DIAGNOSIS — S99911A Unspecified injury of right ankle, initial encounter: Secondary | ICD-10-CM

## 2014-10-08 MED ORDER — HYDROCODONE-ACETAMINOPHEN 5-325 MG PO TABS: 1.0000 | ORAL_TABLET | Freq: Four times a day (QID) | ORAL | Status: DC | PRN

## 2014-10-08 NOTE — Patient Instructions (Signed)
I'm concerned you ruptured your peroneal tendon(s). We will go ahead with an MRI to further assess. Icing 15 minutes at a time 3-4 times a day. Elevate above the level of your heart as much as possible. Aleve 2 tabs twice a day with food for pain and inflammation. Norco as needed for severe pain (no driving on this medication). ACE wrap for compression.

## 2014-10-09 DIAGNOSIS — S99911A Unspecified injury of right ankle, initial encounter: Secondary | ICD-10-CM | POA: Insufficient documentation

## 2014-10-09 NOTE — Assessment & Plan Note (Signed)
concerning for full thickness peroneus brevis tear.  Advised we move forward with MRI to confirm and would go ahead with orthopedic referral if this is confirmed.  In meantime icing, elevation, nsaids with norco as needed.  Crutches if needed.  ACE for compression.

## 2014-10-09 NOTE — Progress Notes (Addendum)
PCP: WILLARD,JENNIFER, PA-C  Subjective:   HPI: Patient is a 48 y.o. female here for right ankle pain.  Patient reports on 12/7 she was walking when she felt a sharp rubber bend popping sensation dorsal and lateral right ankle. Unable to bear weight following this. A lot of swelling. Still cannot fully bend ankle when walking. Has been icing, elevating. No prior injuries.  Past Medical History  Diagnosis Date  . Diabetes mellitus   . Thyroid disease   . Mood disorder     Current Outpatient Prescriptions on File Prior to Visit  Medication Sig Dispense Refill  . ATORVASTATIN CALCIUM PO Take 1 tablet by mouth daily.    Marland Kitchen. buPROPion (WELLBUTRIN XL) 150 MG 24 hr tablet Take 150 mg by mouth daily.    . Canagliflozin (INVOKANA) 100 MG TABS Take 1 tablet by mouth daily.     . furosemide (LASIX) 20 MG tablet Take 20 mg by mouth daily.    . insulin aspart protamine- aspart (NOVOLOG MIX 70/30) (70-30) 100 UNIT/ML injection Inject 26 Units into the skin 2 (two) times daily with a meal.    . insulin glargine (LANTUS) 100 UNIT/ML injection Inject 45 Units into the skin at bedtime.     . Liraglutide (VICTOZA) 18 MG/3ML SOPN Inject 1.8 mg into the skin daily.    . potassium chloride (K-DUR) 10 MEQ tablet Take 1 tablet (10 mEq total) by mouth daily. 10 tablet 0  . Saxagliptin-Metformin (KOMBIGLYZE XR) 02-999 MG TB24 Take 1 tablet by mouth daily.    Marland Kitchen. triamterene-hydrochlorothiazide (MAXZIDE-25) 37.5-25 MG per tablet Take 1 tablet by mouth daily as needed. For swelling     No current facility-administered medications on file prior to visit.    Past Surgical History  Procedure Laterality Date  . Cesarean section      x2    No Known Allergies  History   Social History  . Marital Status: Married    Spouse Name: N/A    Number of Children: N/A  . Years of Education: N/A   Occupational History  . Not on file.   Social History Main Topics  . Smoking status: Never Smoker   . Smokeless  tobacco: Not on file  . Alcohol Use: No  . Drug Use: No  . Sexual Activity: Yes    Birth Control/ Protection: None   Other Topics Concern  . Not on file   Social History Narrative    No family history on file.  BP 107/75 mmHg  Pulse 86  Ht 5\' 7"  (1.702 m)  Wt 220 lb (99.791 kg)  BMI 34.45 kg/m2  LMP 12/28/2011  Review of Systems: See HPI above.    Objective:  Physical Exam:  Gen: NAD  Right ankle: Mod swelling lateral ankle and lateral foot. Full dorsiflexion, plantarflexion, IR.  Mild limitation ER and painful. TTP course of peroneal tendons.  No focal base 5th, navicular, malleolar, other tenderness. Negative ant drawer and talar tilt.   Negative syndesmotic compression. Thompsons test negative. NV intact distally.  MSK u/s R ankle:  Disruption noted about level of ankle joint within peroneal tendons.  A lot of soft tissue swelling as well.  Distally appears to be bunching up of peroneus (appears to be peroneus brevis).    Assessment & Plan:  1. Right ankle injury - concerning for full thickness peroneus brevis tear.  Advised we move forward with MRI to confirm and would go ahead with orthopedic referral if this is confirmed.  In  meantime icing, elevation, nsaids with norco as needed.  Crutches if needed.  ACE for compression.  Addendum:  MRI reviewed and discussed with patient - is actually his peroneus longus that is completely ruptured.  Will refer to foot/ankle specialist urgently to discuss surgical intervention.

## 2014-10-12 ENCOUNTER — Ambulatory Visit (HOSPITAL_BASED_OUTPATIENT_CLINIC_OR_DEPARTMENT_OTHER)
Admission: RE | Admit: 2014-10-12 | Discharge: 2014-10-12 | Disposition: A | Discharge status: Home / Self Care | Payer: BC Managed Care – PPO | Source: Ambulatory Visit | Attending: Family Medicine | Admitting: Family Medicine

## 2014-10-12 DIAGNOSIS — W101XXA Fall (on)(from) sidewalk curb, initial encounter: Secondary | ICD-10-CM | POA: Insufficient documentation

## 2014-10-12 DIAGNOSIS — S96811A Strain of other specified muscles and tendons at ankle and foot level, right foot, initial encounter: Secondary | ICD-10-CM | POA: Insufficient documentation

## 2014-10-12 DIAGNOSIS — M65871 Other synovitis and tenosynovitis, right ankle and foot: Secondary | ICD-10-CM | POA: Insufficient documentation

## 2014-10-12 DIAGNOSIS — M7989 Other specified soft tissue disorders: Secondary | ICD-10-CM | POA: Diagnosis present

## 2014-10-12 DIAGNOSIS — M722 Plantar fascial fibromatosis: Secondary | ICD-10-CM | POA: Diagnosis not present

## 2014-10-12 DIAGNOSIS — M25571 Pain in right ankle and joints of right foot: Secondary | ICD-10-CM | POA: Diagnosis present

## 2014-10-14 NOTE — Addendum Note (Signed)
Addended by: Kathi SimpersWISE, Karnell Vanderloop F on: 10/14/2014 09:58 AM   Modules accepted: Orders

## 2016-01-19 ENCOUNTER — Emergency Department (HOSPITAL_COMMUNITY): Payer: BLUE CROSS/BLUE SHIELD

## 2016-01-19 ENCOUNTER — Encounter (HOSPITAL_COMMUNITY): Payer: Self-pay | Admitting: Emergency Medicine

## 2016-01-19 ENCOUNTER — Emergency Department (HOSPITAL_COMMUNITY)
Admission: EM | Admit: 2016-01-19 | Discharge: 2016-01-19 | Disposition: A | Discharge status: Home / Self Care | Payer: BLUE CROSS/BLUE SHIELD | Attending: Emergency Medicine | Admitting: Emergency Medicine

## 2016-01-19 DIAGNOSIS — Z79899 Other long term (current) drug therapy: Secondary | ICD-10-CM | POA: Diagnosis not present

## 2016-01-19 DIAGNOSIS — W260XXA Contact with knife, initial encounter: Secondary | ICD-10-CM | POA: Insufficient documentation

## 2016-01-19 DIAGNOSIS — Y92009 Unspecified place in unspecified non-institutional (private) residence as the place of occurrence of the external cause: Secondary | ICD-10-CM | POA: Insufficient documentation

## 2016-01-19 DIAGNOSIS — S61011A Laceration without foreign body of right thumb without damage to nail, initial encounter: Secondary | ICD-10-CM

## 2016-01-19 DIAGNOSIS — S61111A Laceration without foreign body of right thumb with damage to nail, initial encounter: Secondary | ICD-10-CM | POA: Insufficient documentation

## 2016-01-19 DIAGNOSIS — Y9389 Activity, other specified: Secondary | ICD-10-CM | POA: Diagnosis not present

## 2016-01-19 DIAGNOSIS — Z7984 Long term (current) use of oral hypoglycemic drugs: Secondary | ICD-10-CM | POA: Insufficient documentation

## 2016-01-19 DIAGNOSIS — Y999 Unspecified external cause status: Secondary | ICD-10-CM | POA: Insufficient documentation

## 2016-01-19 DIAGNOSIS — E119 Type 2 diabetes mellitus without complications: Secondary | ICD-10-CM | POA: Diagnosis not present

## 2016-01-19 MED ORDER — LIDOCAINE HCL (PF) 1 % IJ SOLN
5.0000 mL | Freq: Once | INTRAMUSCULAR | Status: DC
  Filled 2016-01-19: qty 5

## 2016-01-19 MED ORDER — HYDROCODONE-ACETAMINOPHEN 5-325 MG PO TABS
1.0000 | ORAL_TABLET | Freq: Once | ORAL | Status: AC
  Administered 2016-01-19: 1 via ORAL
  Filled 2016-01-19: qty 1

## 2016-01-19 MED ORDER — BUPIVACAINE HCL (PF) 0.25 % IJ SOLN
10.0000 mL | Freq: Once | INTRAMUSCULAR | Status: DC
  Filled 2016-01-19: qty 30

## 2016-01-19 MED ORDER — CEPHALEXIN 250 MG PO CAPS: 250.0000 mg | ORAL_CAPSULE | Freq: Three times a day (TID) | ORAL | Status: DC

## 2016-01-19 NOTE — ED Notes (Signed)
Patient with thumb laceration while cutting vegetables.

## 2016-01-19 NOTE — ED Provider Notes (Signed)
CSN: [id number]     Arrival date & time 01/19/16  1740 History  By signing my name below, I, Elizabeth Jordan, attest that this documentation has been prepared under the direction and in the presence of Elizabeth BuffaloHope Larae Caison, NP.  Electronically Signed: Lyndel SafeKaitlyn Jordan, ED Scribe. 01/19/2016. 7:05 PM.  Chief Complaint  Patient presents with  . Laceration   Patient is a 50 y.o. female presenting with skin laceration. The history is provided by the patient. No language interpreter was used.  Laceration Location:  Finger Finger laceration location:  R thumb Bleeding: controlled with pressure   Time since incident:  2 hours Laceration mechanism:  Knife Pain details:    Severity:  Moderate   Timing:  Constant   Progression:  Unchanged Relieved by:  Pressure Worsened by:  Movement Tetanus status:  Up to date  HPI Comments: Elizabeth Jordan is a 50 y.o. female, with a h/o DM, who presents to the Emergency Department complaining of a laceration sustained to right medial thumb occurring 2 hours ago while cutting vegetables with a mandolin at home. Bleeding is controlled with pressure dressing. No other complaints at this time.    Past Medical History  Diagnosis Date  . Diabetes mellitus   . Thyroid disease   . Mood disorder [facility](HCC)    Past Surgical History  Procedure Laterality Date  . Cesarean section      x2   No family history on file. Social History  Substance Use Topics  . Smoking status: Never Smoker   . Smokeless tobacco: None  . Alcohol Use: No   OB History    No data available     Review of Systems  Skin: Positive for wound.  Hematological: Does not bruise/bleed easily.  All other systems reviewed and are negative.  Allergies  Review of patient's allergies indicates no known allergies.  Home Medications   Prior to Admission medications   Medication Sig Start Date End Date Taking? Authorizing Provider  atorvastatin (LIPITOR) 10 MG tablet Take 10 mg by mouth every morning.  12/06/15  Yes Historical Provider, MD  furosemide (LASIX) 40 MG tablet Take 20-40 mg by mouth daily. 12/06/15  Yes Historical Provider, MD  JARDIANCE 25 MG TABS tablet Take 25 mg by mouth daily. 12/06/15  Yes Historical Provider, MD  levothyroxine (SYNTHROID, LEVOTHROID) 75 MCG tablet Take 75 mcg by mouth daily before breakfast.  09/19/14  Yes Historical Provider, MD  Liraglutide (VICTOZA) 18 MG/3ML SOPN Inject 1.8 mg into the skin daily.   Yes Historical Provider, MD  lisinopril (PRINIVIL,ZESTRIL) 2.5 MG tablet Take 2.5 mg by mouth daily. 12/06/15  Yes Historical Provider, MD  TRESIBA FLEXTOUCH 200 UNIT/ML SOPN Inject 28 Units into the skin at bedtime. 10/28/15  Yes Historical Provider, MD   BP 136/92 mmHg  Pulse 92  Temp(Src) 98.8 F (37.1 C) (Oral)  Resp 18  Ht 5\' 7"  (1.702 m)  Wt 220 lb (99.791 kg)  BMI 34.45 kg/m2  SpO2 100%  LMP 12/28/2011 Physical Exam  Constitutional: She is oriented to person, place, and time. She appears well-developed and well-nourished.  HENT:  Head: Normocephalic and atraumatic.  Eyes: EOM are normal.  Neck: Neck supple.  Cardiovascular: Normal rate.   Pulmonary/Chest: Effort normal.  Abdominal: There is no tenderness.  Musculoskeletal:       Left hand: She exhibits tenderness and laceration. She exhibits normal range of motion. Normal sensation noted. Normal strength noted.       Hands: There is a 2  cm flap laceration that goes through the thumb nail.  Neurological: She is alert and oriented to person, place, and time. No cranial nerve deficit.  Skin: Skin is warm and dry.  Psychiatric: She has a normal mood and affect. Her behavior is normal.  Nursing note and vitals reviewed.   ED Course  .Marland KitchenLaceration Repair Date/Time: 01/19/2016 9:18 PM Performed by: Janne Napoleon Authorized by: Janne Napoleon Consent: Verbal consent obtained. Risks and benefits: risks, benefits and alternatives were discussed Consent given by: patient Patient understanding:  patient states understanding of the procedure being performed Imaging studies: imaging studies available Required items: required blood products, implants, devices, and special equipment available Patient identity confirmed: verbally with patient Body area: upper extremity Location details: right thumb Laceration length: 2 cm Tendon involvement: none Nerve involvement: none Vascular damage: no Anesthesia: digital block Local anesthetic: bupivacaine 0.25% without epinephrine and lidocaine 1% with epinephrine Anesthetic total: 4 ml Patient sedated: no Preparation: Patient was prepped and draped in the usual sterile fashion. Irrigation solution: saline Irrigation method: syringe Amount of cleaning: standard Degree of undermining: none Skin closure: 5-0 Prolene Number of sutures: 5 Technique: simple Approximation: close Approximation difficulty: simple Dressing: pressure dressing and splint Patient tolerance: Patient tolerated the procedure well with no immediate complications    DIAGNOSTIC STUDIES: Oxygen Saturation is 100% on RA, normal by my interpretation.    COORDINATION OF CARE: 7:01 PM Discussed treatment plan with pt at bedside which includes to perform digital block and pt agreed to plan.  7:27 PM Pt consent obtained. Digital block performed with 2 cc of lidocaine 1% w/o epinephrine and 2 cc pf marcaine 0.25 %. Wound explored. Pt tolerated procedure well. Plan to perform laceration repair discussed with pt. Patient agrees with plan.    Imaging Review Dg Finger Thumb Right  01/19/2016  CLINICAL DATA:  Cut distal RIGHT thumb with a potato peeler earlier, laceration with nail bed involvement EXAM: RIGHT THUMB 2+V COMPARISON:  None FINDINGS: Dressing artifacts RIGHT thumb. Osseous mineralization normal. Joint spaces preserved. No acute fracture, dislocation or bone destruction. No gross metallic foreign bodies identified; assessment for subtle foreign bodies is limited by  superimposed dressing artifacts. IMPRESSION: No acute osseous abnormalities. Electronically Signed   By: Ulyses Southward M.D.   On: 01/19/2016 18:35   I have personally reviewed and evaluated these images results as part of my medical decision-making.    MDM  50 y.o. female with laceration through the nail of the right thumb stable for d/c without focal neuro deficits. She will f/u for suture removal in one week or return sooner for any problems. Discussed with the patient that the flap may not survive but for now we have sutured it and it will provide for a dressing and may heal.   Final diagnoses:  Laceration of right thumb, initial encounter   I personally performed the services described in this documentation, which was scribed in my presence. The recorded information has been reviewed and is accurate.    [facility] Orlene Och, NP 01/19/16 2121  Samuel Jester, DO 01/21/16 2353

## 2017-11-08 ENCOUNTER — Encounter: Payer: Self-pay | Admitting: Gastroenterology

## 2017-12-19 ENCOUNTER — Ambulatory Visit (AMBULATORY_SURGERY_CENTER): Payer: Self-pay | Admitting: *Deleted

## 2017-12-19 ENCOUNTER — Other Ambulatory Visit: Payer: Self-pay

## 2017-12-19 VITALS — Ht 68.0 in | Wt 247.0 lb

## 2017-12-19 DIAGNOSIS — Z1211 Encounter for screening for malignant neoplasm of colon: Secondary | ICD-10-CM

## 2017-12-19 MED ORDER — PEG 3350-KCL-NA BICARB-NACL 420 G PO SOLR: 4000.0000 mL | Freq: Once | ORAL | 0 refills | Status: AC

## 2017-12-19 NOTE — Progress Notes (Signed)
No egg or soy allergy known to patient  No issues with past sedation with any surgeries  or procedures, no intubation problems  Never had sedation No diet pills per patient   No home 02 use per patient  No blood thinners per patient  Pt denies issues with constipation  No A fib or A flutter  EMMI video sent to pt's e mail  Pt declined

## 2017-12-22 ENCOUNTER — Encounter: Payer: Self-pay | Admitting: Gastroenterology

## 2018-01-02 ENCOUNTER — Other Ambulatory Visit: Payer: Self-pay

## 2018-01-02 ENCOUNTER — Ambulatory Visit (AMBULATORY_SURGERY_CENTER): Payer: BLUE CROSS/BLUE SHIELD | Admitting: Gastroenterology

## 2018-01-02 ENCOUNTER — Encounter: Payer: Self-pay | Admitting: Gastroenterology

## 2018-01-02 VITALS — BP 96/70 | HR 86 | Temp 96.4°F | Resp 13 | Ht 68.0 in | Wt 247.0 lb

## 2018-01-02 DIAGNOSIS — K5730 Diverticulosis of large intestine without perforation or abscess without bleeding: Secondary | ICD-10-CM

## 2018-01-02 DIAGNOSIS — Z1211 Encounter for screening for malignant neoplasm of colon: Secondary | ICD-10-CM | POA: Diagnosis not present

## 2018-01-02 MED ORDER — SODIUM CHLORIDE 0.9 % IV SOLN: 500.0000 mL | Freq: Once | INTRAVENOUS | Status: AC

## 2018-01-02 NOTE — Op Note (Signed)
[facility] Patient Name: Elizabeth Jordan Procedure Date: 01/02/2018 8:16 AM MRN: [id number] Endoscopist: Rachael Fee , MD Age: 52 Referring MD:  Date of Birth: [birth year] Gender: Female Account #: [id number] Procedure:                Colonoscopy Indications:              Screening for colorectal malignant neoplasm Medicines:                Monitored Anesthesia Care Procedure:                Pre-Anesthesia Assessment:                           - Prior to the procedure, a History and Physical                            was performed, and patient medications and                            allergies were reviewed. The patient's tolerance of                            previous anesthesia was also reviewed. The risks                            and benefits of the procedure and the sedation                            options and risks were discussed with the patient.                            All questions were answered, and informed consent                            was obtained. Prior Anticoagulants: The patient has                            taken no previous anticoagulant or antiplatelet                            agents. ASA Grade Assessment: II - A patient with                            mild systemic disease. After reviewing the risks                            and benefits, the patient was deemed in                            satisfactory condition to undergo the procedure.                           After obtaining informed consent, the colonoscope  was passed under direct vision. Throughout the                            procedure, the patient's blood pressure, pulse, and                            oxygen saturations were monitored continuously. The                            Colonoscope was introduced through the anus and                            advanced to the the cecum, identified by                            appendiceal orifice and  ileocecal valve. The                            colonoscopy was performed without difficulty. The                            patient tolerated the procedure well. The quality                            of the bowel preparation was good. The ileocecal                            valve, appendiceal orifice, and rectum were                            photographed. Scope In: 8:20:19 AM Scope Out: 8:31:11 AM Scope Withdrawal Time: 0 hours 8 minutes 5 seconds  Total Procedure Duration: 0 hours 10 minutes 52 seconds  Findings:                 A few small-mouthed diverticula were found in the                            left colon.                           The exam was otherwise without abnormality on                            direct and retroflexion views. Complications:            No immediate complications. Estimated blood loss:                            None. Estimated Blood Loss:     Estimated blood loss: none. Impression:               - Diverticulosis in the left colon.                           - The examination was otherwise normal on direct  and retroflexion views.                           - No polyps or cancers Recommendation:           - Patient has a contact number available for                            emergencies. The signs and symptoms of potential                            delayed complications were discussed with the                            patient. Return to normal activities tomorrow.                            Written discharge instructions were provided to the                            patient.                           - Resume previous diet.                           - Continue present medications.                           - Repeat colonoscopy in 10 years for screening. Rachael Fee, MD 01/02/2018 8:33:12 AM This report has been signed electronically.

## 2018-01-02 NOTE — Patient Instructions (Signed)
Discharge instructions given. Handouts on diverticulosis and a high fiber diet. Resume previous medications. YOU HAD AN ENDOSCOPIC PROCEDURE TODAY AT THE [facility]:   Refer to the procedure report that was given to you for any specific questions about what was found during the examination.  If the procedure report does not answer your questions, please call your gastroenterologist to clarify.  If you requested that your care partner not be given the details of your procedure findings, then the procedure report has been included in a sealed envelope for you to review at your convenience later.  YOU SHOULD EXPECT: Some feelings of bloating in the abdomen. Passage of more gas than usual.  Walking can help get rid of the air that was put into your GI tract during the procedure and reduce the bloating. If you had a lower endoscopy (such as a colonoscopy or flexible sigmoidoscopy) you may notice spotting of blood in your stool or on the toilet paper. If you underwent a bowel prep for your procedure, you may not have a normal bowel movement for a few days.  Please Note:  You might notice some irritation and congestion in your nose or some drainage.  This is from the oxygen used during your procedure.  There is no need for concern and it should clear up in a day or so.  SYMPTOMS TO REPORT IMMEDIATELY:   Following lower endoscopy (colonoscopy or flexible sigmoidoscopy):  Excessive amounts of blood in the stool  Significant tenderness or worsening of abdominal pains  Swelling of the abdomen that is new, acute  Fever of 100F or higher   For urgent or emergent issues, a gastroenterologist can be reached at any hour by calling [phone].   DIET:  We do recommend a small meal at first, but then you may proceed to your regular diet.  Drink plenty of fluids but you should avoid alcoholic beverages for 24 hours.  ACTIVITY:  You should plan to take it easy for the rest of today and you  should NOT DRIVE or use heavy machinery until tomorrow (because of the sedation medicines used during the test).    FOLLOW UP: Our staff will call the number listed on your records the next business day following your procedure to check on you and address any questions or concerns that you may have regarding the information given to you following your procedure. If we do not reach you, we will leave a message.  However, if you are feeling well and you are not experiencing any problems, there is no need to return our call.  We will assume that you have returned to your regular daily activities without incident.  If any biopsies were taken you will be contacted by phone or by letter within the next 1-3 weeks.  Please call us at [phone] if you have not heard about the biopsies in 3 weeks.    SIGNATURES/CONFIDENTIALITY: You and/or your care partner have signed paperwork which will be entered into your electronic medical record.  These signatures attest to the fact that that the information above on your After Visit Summary has been reviewed and is understood.  Full responsibility of the confidentiality of this discharge information lies with you and/or your care-partner. 

## 2018-01-02 NOTE — Progress Notes (Signed)
Report to PACU, RN, vss, BBS= Clear.  

## 2018-01-02 NOTE — Progress Notes (Signed)
Pt's states no medical or surgical changes since previsit or office visit. 

## 2018-01-02 NOTE — Progress Notes (Signed)
RECHECK BLOOD SUGAR 80.

## 2018-01-03 ENCOUNTER — Telehealth: Payer: Self-pay | Admitting: *Deleted

## 2018-01-03 NOTE — Telephone Encounter (Signed)
No answer, message left for the patient. 

## 2018-06-23 DIAGNOSIS — E039 Hypothyroidism, unspecified: Secondary | ICD-10-CM | POA: Diagnosis not present

## 2018-06-23 DIAGNOSIS — I10 Essential (primary) hypertension: Secondary | ICD-10-CM | POA: Diagnosis not present

## 2018-06-23 DIAGNOSIS — E1122 Type 2 diabetes mellitus with diabetic chronic kidney disease: Secondary | ICD-10-CM | POA: Diagnosis not present

## 2018-06-23 DIAGNOSIS — E782 Mixed hyperlipidemia: Secondary | ICD-10-CM | POA: Diagnosis not present

## 2018-06-28 DIAGNOSIS — N182 Chronic kidney disease, stage 2 (mild): Secondary | ICD-10-CM | POA: Diagnosis not present

## 2018-06-28 DIAGNOSIS — E782 Mixed hyperlipidemia: Secondary | ICD-10-CM | POA: Diagnosis not present

## 2018-06-28 DIAGNOSIS — Z0001 Encounter for general adult medical examination with abnormal findings: Secondary | ICD-10-CM | POA: Diagnosis not present

## 2018-06-28 DIAGNOSIS — E1122 Type 2 diabetes mellitus with diabetic chronic kidney disease: Secondary | ICD-10-CM | POA: Diagnosis not present

## 2018-06-28 DIAGNOSIS — I10 Essential (primary) hypertension: Secondary | ICD-10-CM | POA: Diagnosis not present

## 2018-06-28 DIAGNOSIS — Z6837 Body mass index (BMI) 37.0-37.9, adult: Secondary | ICD-10-CM | POA: Diagnosis not present

## 2018-06-28 DIAGNOSIS — E039 Hypothyroidism, unspecified: Secondary | ICD-10-CM | POA: Diagnosis not present

## 2018-06-28 DIAGNOSIS — N183 Chronic kidney disease, stage 3 (moderate): Secondary | ICD-10-CM | POA: Diagnosis not present

## 2018-07-25 DIAGNOSIS — Z6838 Body mass index (BMI) 38.0-38.9, adult: Secondary | ICD-10-CM | POA: Diagnosis not present

## 2018-07-25 DIAGNOSIS — Z23 Encounter for immunization: Secondary | ICD-10-CM | POA: Diagnosis not present

## 2018-07-25 DIAGNOSIS — E782 Mixed hyperlipidemia: Secondary | ICD-10-CM | POA: Diagnosis not present

## 2018-07-25 DIAGNOSIS — E039 Hypothyroidism, unspecified: Secondary | ICD-10-CM | POA: Diagnosis not present

## 2018-07-25 DIAGNOSIS — I129 Hypertensive chronic kidney disease with stage 1 through stage 4 chronic kidney disease, or unspecified chronic kidney disease: Secondary | ICD-10-CM | POA: Diagnosis not present

## 2018-07-25 DIAGNOSIS — E1122 Type 2 diabetes mellitus with diabetic chronic kidney disease: Secondary | ICD-10-CM | POA: Diagnosis not present

## 2018-07-25 DIAGNOSIS — N183 Chronic kidney disease, stage 3 (moderate): Secondary | ICD-10-CM | POA: Diagnosis not present

## 2018-11-03 ENCOUNTER — Other Ambulatory Visit: Payer: Self-pay | Admitting: *Deleted

## 2018-11-03 NOTE — Patient Outreach (Signed)
[facility]) Care Management  11/03/2018  Elizabeth Jordan 28-Aug-1966 [id number]   Please refer to other note of today documented by this RNCM.  Bary Richard RN,CCM,CDE [facility] Care Management Coordinator Office Phone [phone] Office Fax [phone]

## 2018-11-03 NOTE — Patient Outreach (Signed)
[facility]) Care Management  11/03/2018  Elizabeth Jordan [birth year] [id number]   Date referral received: 11/01/18 Initial outreach: 11/03/18 Insurance: Monia Pouch  Subjective: Initial unsuccessful telephone call to patient's preferred number for [employer]. Unable to leave a message as the voice mailbox was full.   Plan: This RNCM will attempt another outreach within 4 business days.  Bary Richard RN,CCM,CDE [facility] Care Management Coordinator Office Phone [phone] Office Fax [phone]

## 2018-11-08 ENCOUNTER — Other Ambulatory Visit: Payer: Self-pay | Admitting: *Deleted

## 2018-11-08 ENCOUNTER — Ambulatory Visit: Payer: Self-pay | Admitting: *Deleted

## 2018-11-08 NOTE — Patient Outreach (Addendum)
[facility]) Care Management  11/08/2018  Elizabeth Jordan September 17, 1966 [id number]   Date referral received: 11/01/18 Initial outreach: 11/03/18 Insurance: Monia Pouch  Subjective: Second unsuccessful telephone call to patient's only contact number for [facility] referral outreach related to assistance with diabetes management.. Unable to leave a message as the voice mailbox was full.   Plan: This RNCM will route unsuccessful outreach letter with [facility] Care Management pamphlet and 24 hour Nurse Advice Line Magnet to [employer] Care Management clinical pool to be mailed to patient's home address.  This RNCM will attempt a final outreach within 4 business days.  Bary Richard RN,CCM,CDE [facility] Care Management Coordinator Office Phone [phone] Office Fax [phone]

## 2018-11-13 ENCOUNTER — Other Ambulatory Visit: Payer: Self-pay | Admitting: *Deleted

## 2018-11-13 ENCOUNTER — Ambulatory Visit: Payer: Self-pay | Admitting: *Deleted

## 2018-11-13 NOTE — Patient Outreach (Addendum)
[facility]) Care Management  11/13/2018  Elizabeth Jordan [birth year] [id number]   Transition of Care Date referral received:11/01/18 Initial outreach:11/03/18 Insurance:[employer]  Subjective: Third unsuccessful telephone call to patient's only contact numberfor [employer] referral outreach related to assistance with diabetes management.. Unable to leave a message as the voice mailbox was full.No response to unsuccessful outreach letter mailed to patient on 11/08/18 requesting she contact this RNCM.  Plan: Will close case  to [facility] Care Management services on 1/23; 10 business days after initial outreach attempt, if no response from patient to unsuccessful outreach letter.   Bary Richard RN,CCM,CDE [facility] Care Management Coordinator Office Phone [phone] Office Fax [phone]

## 2018-11-16 ENCOUNTER — Other Ambulatory Visit: Payer: Self-pay | Admitting: *Deleted

## 2018-11-16 NOTE — Patient Outreach (Signed)
[facility]) Care Management  11/16/2018  ORVILLA NAEEM 1966/08/23 [id number]   Transition of Care Case Closure- Unsuccessful Outreach Date referral received:11/01/18 Initial outreach:11/03/18 Insurance:[employer]  Subjective/Objective: Patient has not responded to unsuccessful outreach letter with request to contact this RNCM and unable to leave voice mail for patient on only listed contact number.   Plan: Will close case  to [facility] Care Management services.  Bary Richard RN,CCM,CDE [facility] Care Management Coordinator Office Phone [phone] Office Fax [phone]

## 2020-11-24 DIAGNOSIS — I129 Hypertensive chronic kidney disease with stage 1 through stage 4 chronic kidney disease, or unspecified chronic kidney disease: Secondary | ICD-10-CM | POA: Insufficient documentation

## 2020-11-24 DIAGNOSIS — Z6839 Body mass index (BMI) 39.0-39.9, adult: Secondary | ICD-10-CM

## 2020-11-24 DIAGNOSIS — N1831 Chronic kidney disease, stage 3a: Secondary | ICD-10-CM | POA: Insufficient documentation

## 2020-11-24 DIAGNOSIS — E1122 Type 2 diabetes mellitus with diabetic chronic kidney disease: Secondary | ICD-10-CM

## 2020-11-24 DIAGNOSIS — E039 Hypothyroidism, unspecified: Secondary | ICD-10-CM | POA: Diagnosis present

## 2020-11-24 DIAGNOSIS — Z6837 Body mass index (BMI) 37.0-37.9, adult: Secondary | ICD-10-CM | POA: Insufficient documentation

## 2020-11-24 DIAGNOSIS — E782 Mixed hyperlipidemia: Secondary | ICD-10-CM | POA: Diagnosis present

## 2021-10-02 ENCOUNTER — Encounter (HOSPITAL_COMMUNITY): Payer: Self-pay | Admitting: Emergency Medicine

## 2021-10-02 ENCOUNTER — Inpatient Hospital Stay (HOSPITAL_COMMUNITY)
Admit: 2021-10-02 | Discharge: 2021-10-02 | Disposition: A | Discharge status: Home / Self Care | Payer: Commercial Managed Care - PPO | Attending: Neurology | Admitting: Neurology

## 2021-10-02 ENCOUNTER — Emergency Department (HOSPITAL_COMMUNITY): Payer: Commercial Managed Care - PPO

## 2021-10-02 ENCOUNTER — Other Ambulatory Visit: Payer: Self-pay

## 2021-10-02 ENCOUNTER — Observation Stay (HOSPITAL_COMMUNITY)
Admission: EM | Admit: 2021-10-02 | Discharge: 2021-10-03 | Disposition: A | Discharge status: Home / Self Care | Payer: Commercial Managed Care - PPO | Attending: Family Medicine | Admitting: Family Medicine

## 2021-10-02 DIAGNOSIS — E039 Hypothyroidism, unspecified: Secondary | ICD-10-CM | POA: Insufficient documentation

## 2021-10-02 DIAGNOSIS — Z6839 Body mass index (BMI) 39.0-39.9, adult: Secondary | ICD-10-CM | POA: Diagnosis not present

## 2021-10-02 DIAGNOSIS — Z794 Long term (current) use of insulin: Secondary | ICD-10-CM | POA: Diagnosis not present

## 2021-10-02 DIAGNOSIS — N1831 Chronic kidney disease, stage 3a: Secondary | ICD-10-CM | POA: Insufficient documentation

## 2021-10-02 DIAGNOSIS — E782 Mixed hyperlipidemia: Secondary | ICD-10-CM | POA: Diagnosis not present

## 2021-10-02 DIAGNOSIS — R569 Unspecified convulsions: Secondary | ICD-10-CM | POA: Diagnosis not present

## 2021-10-02 DIAGNOSIS — F17200 Nicotine dependence, unspecified, uncomplicated: Secondary | ICD-10-CM | POA: Diagnosis not present

## 2021-10-02 DIAGNOSIS — Z20822 Contact with and (suspected) exposure to covid-19: Secondary | ICD-10-CM | POA: Diagnosis not present

## 2021-10-02 DIAGNOSIS — E1165 Type 2 diabetes mellitus with hyperglycemia: Secondary | ICD-10-CM | POA: Diagnosis not present

## 2021-10-02 DIAGNOSIS — R112 Nausea with vomiting, unspecified: Secondary | ICD-10-CM

## 2021-10-02 DIAGNOSIS — E162 Hypoglycemia, unspecified: Secondary | ICD-10-CM | POA: Diagnosis present

## 2021-10-02 DIAGNOSIS — E1122 Type 2 diabetes mellitus with diabetic chronic kidney disease: Secondary | ICD-10-CM

## 2021-10-02 DIAGNOSIS — E11649 Type 2 diabetes mellitus with hypoglycemia without coma: Secondary | ICD-10-CM | POA: Diagnosis present

## 2021-10-02 DIAGNOSIS — G9340 Encephalopathy, unspecified: Secondary | ICD-10-CM | POA: Diagnosis not present

## 2021-10-02 LAB — GLUCOSE, CAPILLARY
Glucose-Capillary: 45 mg/dL — ABNORMAL LOW (ref 70–99)
Glucose-Capillary: 69 mg/dL — ABNORMAL LOW (ref 70–99)
Glucose-Capillary: 123 mg/dL — ABNORMAL HIGH (ref 70–99)

## 2021-10-02 LAB — CBG MONITORING, ED
Glucose-Capillary: 124 mg/dL — ABNORMAL HIGH (ref 70–99)
Glucose-Capillary: 30 mg/dL — CL (ref 70–99)
Glucose-Capillary: 105 mg/dL — ABNORMAL HIGH (ref 70–99)
Glucose-Capillary: 103 mg/dL — ABNORMAL HIGH (ref 70–99)

## 2021-10-02 LAB — COMPREHENSIVE METABOLIC PANEL
ALT: 18 U/L (ref 0–44)
AST: 23 U/L (ref 15–41)
Albumin: 3.5 g/dL (ref 3.5–5.0)
Alkaline Phosphatase: 90 U/L (ref 38–126)
Anion gap: 9 (ref 5–15)
BUN: 20 mg/dL (ref 6–20)
CO2: 24 mmol/L (ref 22–32)
Calcium: 8.6 mg/dL — ABNORMAL LOW (ref 8.9–10.3)
Chloride: 110 mmol/L (ref 98–111)
Creatinine, Ser: 1.2 mg/dL — ABNORMAL HIGH (ref 0.44–1.00)
GFR, Estimated: 53 mL/min — ABNORMAL LOW (ref >60)
Glucose, Bld: 125 mg/dL — ABNORMAL HIGH (ref 70–99)
Potassium: 3.7 mmol/L (ref 3.5–5.1)
Sodium: 143 mmol/L (ref 135–145)
Total Bilirubin: 0.6 mg/dL (ref 0.3–1.2)
Total Protein: 6.5 g/dL (ref 6.5–8.1)

## 2021-10-02 LAB — CBC
HCT: 39 % (ref 36.0–46.0)
HCT: 35.7 % — ABNORMAL LOW (ref 36.0–46.0)
Hemoglobin: 11.9 g/dL — ABNORMAL LOW (ref 12.0–15.0)
Hemoglobin: 11.2 g/dL — ABNORMAL LOW (ref 12.0–15.0)
MCH: 28.7 pg (ref 26.0–34.0)
MCH: 29.2 pg (ref 26.0–34.0)
MCHC: 30.5 g/dL (ref 30.0–36.0)
MCHC: 31.4 g/dL (ref 30.0–36.0)
MCV: 94.2 fL (ref 80.0–100.0)
MCV: 93 fL (ref 80.0–100.0)
Platelets: 185 10*3/uL (ref 150–400)
Platelets: 209 10*3/uL (ref 150–400)
RBC: 4.14 MIL/uL (ref 3.87–5.11)
RBC: 3.84 MIL/uL — ABNORMAL LOW (ref 3.87–5.11)
RDW: 13 % (ref 11.5–15.5)
RDW: 13.1 % (ref 11.5–15.5)
WBC: 6.1 10*3/uL (ref 4.0–10.5)
WBC: 6 10*3/uL (ref 4.0–10.5)
nRBC: 0 % (ref 0.0–0.2)
nRBC: 0 % (ref 0.0–0.2)

## 2021-10-02 LAB — HEMOGLOBIN A1C
Hgb A1c MFr Bld: 8.9 % — ABNORMAL HIGH (ref 4.8–5.6)
Mean Plasma Glucose: 208.73 mg/dL

## 2021-10-02 LAB — HIV ANTIBODY (ROUTINE TESTING W REFLEX): HIV Screen 4th Generation wRfx: NONREACTIVE

## 2021-10-02 LAB — URINALYSIS, ROUTINE W REFLEX MICROSCOPIC
Bilirubin Urine: NEGATIVE
Glucose, UA: NEGATIVE mg/dL
Ketones, ur: NEGATIVE mg/dL
Leukocytes,Ua: NEGATIVE
Nitrite: NEGATIVE
Protein, ur: >300 mg/dL — AB
Specific Gravity, Urine: >1.03 — ABNORMAL HIGH (ref 1.005–1.030)
pH: 6 (ref 5.0–8.0)

## 2021-10-02 LAB — URINALYSIS, MICROSCOPIC (REFLEX)
Bacteria, UA: NONE SEEN
Squamous Epithelial / HPF: NONE SEEN (ref 0–5)
WBC, UA: NONE SEEN WBC/hpf (ref 0–5)

## 2021-10-02 LAB — RESP PANEL BY RT-PCR (FLU A&B, COVID) ARPGX2
Influenza A by PCR: NEGATIVE
Influenza B by PCR: NEGATIVE
SARS Coronavirus 2 by RT PCR: NEGATIVE

## 2021-10-02 LAB — I-STAT BETA HCG BLOOD, ED (MC, WL, AP ONLY): I-stat hCG, quantitative: <5 m[IU]/mL (ref <5)

## 2021-10-02 LAB — CREATININE, SERUM
Creatinine, Ser: 1.14 mg/dL — ABNORMAL HIGH (ref 0.44–1.00)
GFR, Estimated: 57 mL/min — ABNORMAL LOW (ref >60)

## 2021-10-02 LAB — TSH: TSH: 8.843 u[IU]/mL — ABNORMAL HIGH (ref 0.350–4.500)

## 2021-10-02 LAB — T4, FREE: Free T4: 0.74 ng/dL (ref 0.61–1.12)

## 2021-10-02 LAB — BRAIN NATRIURETIC PEPTIDE: B Natriuretic Peptide: 118 pg/mL — ABNORMAL HIGH (ref 0.0–100.0)

## 2021-10-02 LAB — MAGNESIUM: Magnesium: 2.1 mg/dL (ref 1.7–2.4)

## 2021-10-02 LAB — LIPASE, BLOOD: Lipase: 28 U/L (ref 11–51)

## 2021-10-02 MED ORDER — OXYCODONE HCL 5 MG PO TABS: 5.0000 mg | ORAL_TABLET | ORAL | Status: DC | PRN

## 2021-10-02 MED ORDER — SODIUM CHLORIDE 0.9% FLUSH: 3.0000 mL | INTRAVENOUS | Status: DC | PRN

## 2021-10-02 MED ORDER — ACETAMINOPHEN 325 MG PO TABS
650.0000 mg | ORAL_TABLET | Freq: Four times a day (QID) | ORAL | Status: DC | PRN
  Administered 2021-10-02: 650 mg via ORAL
  Filled 2021-10-02: qty 2

## 2021-10-02 MED ORDER — DEXTROSE 50 % IV SOLN
INTRAVENOUS | Status: AC
  Filled 2021-10-02: qty 50

## 2021-10-02 MED ORDER — HYDRALAZINE HCL 20 MG/ML IJ SOLN: 10.0000 mg | INTRAMUSCULAR | Status: DC | PRN

## 2021-10-02 MED ORDER — SODIUM CHLORIDE 0.9 % IV BOLUS
1000.0000 mL | Freq: Once | INTRAVENOUS | Status: AC
  Administered 2021-10-02: 1000 mL via INTRAVENOUS

## 2021-10-02 MED ORDER — HYDROMORPHONE HCL 1 MG/ML IJ SOLN
0.5000 mg | INTRAMUSCULAR | Status: DC | PRN
  Administered 2021-10-02: 1 mg via INTRAVENOUS
  Filled 2021-10-02: qty 1

## 2021-10-02 MED ORDER — LEVALBUTEROL HCL 0.63 MG/3ML IN NEBU: 0.6300 mg | INHALATION_SOLUTION | Freq: Four times a day (QID) | RESPIRATORY_TRACT | Status: DC | PRN

## 2021-10-02 MED ORDER — ACETAMINOPHEN 650 MG RE SUPP: 650.0000 mg | Freq: Four times a day (QID) | RECTAL | Status: DC | PRN

## 2021-10-02 MED ORDER — LEVOTHYROXINE SODIUM 75 MCG PO TABS
150.0000 ug | ORAL_TABLET | Freq: Every day | ORAL | Status: DC
Start: 2021-10-03 — End: 2021-10-03
  Administered 2021-10-03: 150 ug via ORAL
  Filled 2021-10-02: qty 2

## 2021-10-02 MED ORDER — ONDANSETRON HCL 4 MG PO TABS: 4.0000 mg | ORAL_TABLET | Freq: Four times a day (QID) | ORAL | Status: DC | PRN

## 2021-10-02 MED ORDER — DEXTROSE-NACL 5-0.45 % IV SOLN: INTRAVENOUS | Status: DC

## 2021-10-02 MED ORDER — SODIUM CHLORIDE 0.9 % IV SOLN: INTRAVENOUS | Status: DC

## 2021-10-02 MED ORDER — DEXTROSE 50 % IV SOLN
50.0000 mL | Freq: Once | INTRAVENOUS | Status: AC
  Administered 2021-10-02: 50 mL via INTRAVENOUS

## 2021-10-02 MED ORDER — ATORVASTATIN CALCIUM 10 MG PO TABS
10.0000 mg | ORAL_TABLET | Freq: Every evening | ORAL | Status: DC
  Administered 2021-10-02: 10 mg via ORAL
  Filled 2021-10-02: qty 1

## 2021-10-02 MED ORDER — SODIUM CHLORIDE 0.9% FLUSH
3.0000 mL | Freq: Two times a day (BID) | INTRAVENOUS | Status: DC
  Administered 2021-10-02: 3 mL via INTRAVENOUS

## 2021-10-02 MED ORDER — SODIUM CHLORIDE 0.9% FLUSH
3.0000 mL | Freq: Two times a day (BID) | INTRAVENOUS | Status: DC
  Administered 2021-10-03: 3 mL via INTRAVENOUS

## 2021-10-02 MED ORDER — SENNOSIDES-DOCUSATE SODIUM 8.6-50 MG PO TABS: 1.0000 | ORAL_TABLET | Freq: Every evening | ORAL | Status: DC | PRN

## 2021-10-02 MED ORDER — SODIUM CHLORIDE 0.9 % IV SOLN: 250.0000 mL | INTRAVENOUS | Status: DC | PRN

## 2021-10-02 MED ORDER — TRAZODONE HCL 50 MG PO TABS: 25.0000 mg | ORAL_TABLET | Freq: Every evening | ORAL | Status: DC | PRN

## 2021-10-02 MED ORDER — LISINOPRIL 5 MG PO TABS
2.5000 mg | ORAL_TABLET | Freq: Every day | ORAL | Status: DC
  Administered 2021-10-02 – 2021-10-03 (×2): 2.5 mg via ORAL
  Filled 2021-10-02 (×2): qty 1

## 2021-10-02 MED ORDER — IPRATROPIUM BROMIDE 0.02 % IN SOLN: 0.5000 mg | Freq: Four times a day (QID) | RESPIRATORY_TRACT | Status: DC | PRN

## 2021-10-02 MED ORDER — ONDANSETRON HCL 4 MG/2ML IJ SOLN: 4.0000 mg | Freq: Four times a day (QID) | INTRAMUSCULAR | Status: DC | PRN

## 2021-10-02 MED ORDER — LORAZEPAM 2 MG/ML IJ SOLN
1.0000 mg | Freq: Once | INTRAMUSCULAR | Status: AC
  Administered 2021-10-02: 1 mg via INTRAVENOUS
  Filled 2021-10-02: qty 1

## 2021-10-02 MED ORDER — LORAZEPAM 2 MG/ML IJ SOLN: 1.0000 mg | INTRAMUSCULAR | Status: DC | PRN

## 2021-10-02 MED ORDER — HEPARIN SODIUM (PORCINE) 5000 UNIT/ML IJ SOLN
5000.0000 [IU] | Freq: Three times a day (TID) | INTRAMUSCULAR | Status: DC
  Administered 2021-10-02 – 2021-10-03 (×4): 5000 [IU] via SUBCUTANEOUS
  Filled 2021-10-02 (×4): qty 1

## 2021-10-02 MED ORDER — INSULIN ASPART 100 UNIT/ML IJ SOLN
0.0000 [IU] | Freq: Three times a day (TID) | INTRAMUSCULAR | Status: DC
  Administered 2021-10-03: 1 [IU] via SUBCUTANEOUS

## 2021-10-02 MED ORDER — BISACODYL 5 MG PO TBEC: 5.0000 mg | DELAYED_RELEASE_TABLET | Freq: Every day | ORAL | Status: DC | PRN

## 2021-10-02 NOTE — Hospital Course (Signed)
  Elizabeth Jordan is a 55 y.o. female with medical history significant of DM II -insulin-dependent, CKD 3A, hyperlipidemia, mood disorder, hypothyroidism, presented to ED with chief complaint of hypoglycemia and altered mental status.  Per ED report blood sugars this morning was as low as 30s.  Apparently had an unknown responsive episode. Reportedly was found by her husband on the ground.  Husband gave her sugar gel.    Episode once again happened in ED with blood sugars this time was 103.  Episode was described as patient was not responding to verbal command for about 15 minutes staring. Patient has had an MRI, and a CT of the head which was negative Case was discussed with on-call telemetry neurology Dr. Selina Cooley who has ordered an EEG,  Also recommending repeat MRI without contrast since initial 1 had motion degradation.

## 2021-10-02 NOTE — ED Provider Notes (Addendum)
Patient with a similar episode where she was unresponsive for a brief period of time like when her blood sugars are low.  However her blood sugar was 103 so not responsible for this episode.  Patient was not able to talk initially was not doing any movement but then quickly started around to move her extremities spontaneously.  Remained nonverbal for probably about 15 minutes.  But now back to baseline.  Based on this there was questions raised whether this was as absence type seizure or whether this was a TIA.  Head CT negative patient TSH was ordered based on this and it is markedly elevated at 8.843.  So patient may very well be hypothyroid.  Could explain some of this.  We will go ahead and do MRI then will discuss with neurology regarding admission.  Feel that patient needs admission even if MRI does not have any distinct findings.  CRITICAL CARE Performed by: Vanetta Mulders Total critical care time: 35 minutes Critical care time was exclusive of separately billable procedures and treating other patients. Critical care was necessary to treat or prevent imminent or life-threatening deterioration. Critical care was time spent personally by me on the following activities: development of treatment plan with patient and/or surrogate as well as nursing, discussions with consultants, evaluation of patient's response to treatment, examination of patient, obtaining history from patient or surrogate, ordering and performing treatments and interventions, ordering and review of laboratory studies, ordering and review of radiographic studies, pulse oximetry and re-evaluation of patient's condition.    Vanetta Mulders, MD 10/02/21 [phone]   Gust with the telemetry neurologist Dr. Selina Cooley.  MRI without any distinct abnormalities.  But not the best study.  Patient's TSH is very low consistent with hypothyroidism as mentioned above.  Dr. Selina Cooley is sending the EEG team out once hospitalist admit the patient here.  Once  patient is back to baseline which is not the case at the moment they want to go ahead and repeat MRI.  We will discussed with hospitalist.   Vanetta Mulders, MD 10/02/21 1201

## 2021-10-02 NOTE — ED Provider Notes (Signed)
[facility]-EMERGENCY DEPT [facility] Emergency Department Provider Note MRN:  [id number]  Arrival date & time: 10/02/21     Chief Complaint   Hypoglycemia   History of Present Illness   Elizabeth Jordan is a 56 y.o. year-old female with a history of hypertension, diabetes, CKD presenting to the ED with chief complaint of hypoglycemia.  Blood sugar in the 30s this evening, patient does not remember what happened.  She is alert and oriented, she is complaining of an abrasion to the left forearm but otherwise denies any pain, no symptoms at this time.  Reportedly was found on the ground by husband and given sugar gel.  Has been having some nausea vomiting and diarrhea for the past 2 days.  No abdominal pain.  No fever.  No chest pain or shortness of breath.  No cough.  Review of Systems  A complete 10 system review of systems was obtained and all systems are negative except as noted in the HPI and PMH.   Patient's Health History    Past Medical History:  Diagnosis Date   Chronic kidney disease    Diabetes mellitus    Hyperlipidemia    Mood disorder (HCC)    Thyroid disease    no longer taking medication    Past Surgical History:  Procedure Laterality Date   CESAREAN SECTION     x2   CESAREAN SECTION     x 2    Family History  Problem Relation Age of Onset   Colon polyps Father    Colon cancer Neg Hx    Esophageal cancer Neg Hx    Rectal cancer Neg Hx    Stomach cancer Neg Hx     Social History   Socioeconomic History   Marital status: Married    Spouse name: Not on file   Number of children: Not on file   Years of education: Not on file   Highest education level: Not on file  Occupational History   Not on file  Tobacco Use   Smoking status: Never   Smokeless tobacco: Current  Vaping Use   Vaping Use: Never used  Substance and Sexual Activity   Alcohol use: No    Alcohol/week: 0.0 standard drinks   Drug use: No   Sexual activity: Yes    Birth  control/protection: None  Other Topics Concern   Not on file  Social History Narrative   Not on file   Social Determinants of Health   Financial Resource Strain: Not on file  Food Insecurity: Not on file  Transportation Needs: Not on file  Physical Activity: Not on file  Stress: Not on file  Social Connections: Not on file  Intimate Partner Violence: Not on file     Physical Exam   Vitals:   10/02/21 0505 10/02/21 0530  BP: 139/86 (!) 161/82  Pulse: 94 94  Resp: 18 16  Temp: (!) 97.5 F (36.4 C)   SpO2: 99% 96%    CONSTITUTIONAL: Well-appearing, NAD NEURO:  Alert and oriented x 3, no focal deficits EYES:  eyes equal and reactive ENT/NECK:  no LAD, no JVD CARDIO: Regular rate, well-perfused, normal S1 and S2 PULM:  CTAB no wheezing or rhonchi GI/GU:  normal bowel sounds, non-distended, non-tender MSK/SPINE:  No gross deformities, no edema SKIN:  no rash, atraumatic PSYCH:  Appropriate speech and behavior  *Additional and/or pertinent findings included in MDM below  Diagnostic and Interventional Summary    EKG Interpretation  Date/Time:  Ventricular Rate:    PR Interval:    QRS Duration:   QT Interval:    QTC Calculation:   R Axis:     Text Interpretation:         Labs Reviewed  CBC - Abnormal; Notable for the following components:      Result Value   Hemoglobin 11.9 (*)    All other components within normal limits  COMPREHENSIVE METABOLIC PANEL - Abnormal; Notable for the following components:   Glucose, Bld 125 (*)    Creatinine, Ser 1.20 (*)    Calcium 8.6 (*)    GFR, Estimated 53 (*)    All other components within normal limits  CBG MONITORING, ED - Abnormal; Notable for the following components:   Glucose-Capillary 30 (*)    All other components within normal limits  CBG MONITORING, ED - Abnormal; Notable for the following components:   Glucose-Capillary 124 (*)    All other components within normal limits  CBG MONITORING, ED - Abnormal;  Notable for the following components:   Glucose-Capillary 105 (*)    All other components within normal limits  LIPASE, BLOOD  URINALYSIS, ROUTINE W REFLEX MICROSCOPIC    DG Chest Port 1 View  Final Result      Medications  dextrose 50 % solution 50 mL (50 mLs Intravenous Given 10/02/21 0354)  sodium chloride 0.9 % bolus 1,000 mL (1,000 mLs Intravenous New Bag/Given 10/02/21 0508)     Procedures  /  Critical Care Procedures  ED Course and Medical Decision Making  I have reviewed the triage vital signs, the nursing notes, and pertinent available records from the EMR.  Listed above are laboratory and imaging tests that I personally ordered, reviewed, and interpreted and then considered in my medical decision making (see below for details).  Hypoglycemia, recent GI illness, awaiting labs, monitor blood sugar closely.  Could be that patient has been taking too much insulin given that she has not been eating as much.     Patient continues to have normal vital signs and is well-appearing.  Blood glucose downtrending a bit at 100, providing orange juice and will continue to monitor.  Still awaiting urinalysis.  If blood sugar can stabilize and patient continues to do well, anticipating discharge.  Abdomen is soft and nontender, no rebound guarding or rigidity, currently with no indication for advanced imaging of the abdomen, suspect viral gastroenteritis.  Signed out to oncoming provider at shift change.  Elmer Sow. Pilar Plate, MD [facility] Emergency Medicine [facility] [email or web address]  Final Clinical Impressions(s) / ED Diagnoses     ICD-10-CM   1. Nausea vomiting and diarrhea  R11.2    R19.7     2. Hypoglycemia  E16.2       ED Discharge Orders     None        Discharge Instructions Discussed with and Provided to Patient:   Discharge Instructions   None       Sabas Sous, MD 10/02/21 [phone]

## 2021-10-02 NOTE — ED Triage Notes (Signed)
Pt brought in from home by [facility] for AMS. Pt fell out of bed tonight causing laceration to left forearm. Per pts husband pt is altered when her glucose drops. Pt was being feed jelly by husband when EMS arrived.  Pt able to answer questions at this time.

## 2021-10-02 NOTE — ED Notes (Signed)
Patient is back to baseline able to follow commands and answer all questions asked by husband.  Updated family on plan for CT MRI and admission either here or at [facility].

## 2021-10-02 NOTE — Procedures (Signed)
Patient Name: Elizabeth Jordan  MRN: [id number]  Epilepsy Attending: Charlsie Quest  Referring Physician/Provider: Dr Bing Neighbors Date: 10/02/2021 Duration: 22.23 mins  Patient history: 55 yo woman who had a spell in ED where she was poorly responsive and nonverbal for approx 15 min. EEG to evaluate for seizure  Level of alertness: Awake, asleep  AEDs during EEG study: None  Technical aspects: This EEG study was done with scalp electrodes positioned according to the 10-20 International system of electrode placement. Electrical activity was acquired at a sampling rate of 500Hz  and reviewed with a high frequency filter of 70Hz  and a low frequency filter of 1Hz . EEG data were recorded continuously and digitally stored.   Description: The posterior dominant rhythm consists of 8 Hz activity of moderate voltage (25-35 uV) seen predominantly in posterior head regions, symmetric and reactive to eye opening and eye closing. Sleep was characterized by vertex waves, sleep spindles (12 to 14 Hz), maximal frontocentral region.  Hyperventilation and photic stimulation were not performed.     IMPRESSION: This study is within normal limits. No seizures or epileptiform discharges were seen throughout the recording.  Adal Sereno 

## 2021-10-02 NOTE — Evaluation (Signed)
Physical Therapy Evaluation Patient Details Name: Elizabeth Jordan MRN: [id number] DOB: October 30, 1965 Today's Date: 10/02/2021  History of Present Illness  Elizabeth Jordan is a 55 y.o. female with medical history significant of DM II -insulin-dependent, CKD 3A, hyperlipidemia, mood disorder, hypothyroidism, presented to ED with chief complaint of hypoglycemia and altered mental status.     Per ED report blood sugars this morning was as low as 30s.  Apparently had an unknown responsive episode.  Reportedly was found by her husband on the ground.  Husband gave her sugar gel.       Episode once again happened in ED with blood sugars this time was 103.  Episode was described as patient was not responding to verbal command for about 15 minutes staring.  Patient has had an MRI, and a CT of the head which was negative   Clinical Impression   Patient demonstrates improved mentation and is A&Ox4 and answers appropriately to questions and historical recall.  Able to demo independent functional mobility and not requiring assistance for bed mobility, transfers, or ambulation on level surfaces. Did not test stair ambulation due to IV pole but pt able to perform normal dynamic standing balance activities with low risk for falls. Pt does not need PT services at this time and recommend Patient discharged to care of nursing for ambulation daily as tolerated for length of stay.        Recommendations for follow up therapy are one component of a multi-disciplinary discharge planning process, led by the attending physician.  Recommendations may be updated based on patient status, additional functional criteria and insurance authorization.  Follow Up Recommendations No PT follow up    Assistance Recommended at Discharge PRN  Functional Status Assessment Patient has not had a recent decline in their functional status  Equipment Recommendations  None recommended by PT    Recommendations for Other Services       Precautions  / Restrictions        Mobility  Bed Mobility Overal bed mobility: Independent                  Transfers Overall transfer level: Independent                      Ambulation/Gait Ambulation/Gait assistance: Independent   Assistive device: IV Pole Gait Pattern/deviations: WFL(Within Functional Limits)          Stairs Stairs:  (DNT due to IV pole)          Wheelchair Mobility    Modified Rankin (Stroke Patients Only)       Balance Overall balance assessment: Independent           Standing balance-Leahy Scale: Normal             Rhomberg - Eyes Closed: 30     Standardized Balance Assessment Standardized Balance Assessment : TUG: Timed Up and Go Test     Timed Up and Go Test TUG: Normal TUG Normal TUG (seconds): 12     Pertinent Vitals/Pain Pain Assessment: No/denies pain    Home Living Family/patient expects to be discharged to:: Private residence Living Arrangements: Spouse/significant other Available Help at Discharge: Family Type of Home: House Home Access: Stairs to enter   Secretary/administrator of Steps: 2-3   Home Layout: One level Home Equipment: None      Prior Function Prior Level of Function : Independent/Modified Independent;Driving  Hand Dominance        Extremity/Trunk Assessment   Upper Extremity Assessment Upper Extremity Assessment: Overall WFL for tasks assessed    Lower Extremity Assessment Lower Extremity Assessment: Overall WFL for tasks assessed    Cervical / Trunk Assessment Cervical / Trunk Assessment: Normal  Communication   Communication: No difficulties  Cognition Arousal/Alertness: Awake/alert Behavior During Therapy: WFL for tasks assessed/performed;Flat affect Overall Cognitive Status: Within Functional Limits for tasks assessed                                          General Comments      Exercises     Assessment/Plan     PT Assessment Patient does not need any further PT services  PT Problem List         PT Treatment Interventions      PT Goals (Current goals can be found in the Care Plan section)  Acute Rehab PT Goals Patient Stated Goal: None needed PT Goal Formulation: With patient/family Time For Goal Achievement: 10/02/21 Potential to Achieve Goals: Good    Frequency     Barriers to discharge        Co-evaluation               AM-PAC PT "6 Clicks" Mobility  Outcome Measure Help needed turning from your back to your side while in a flat bed without using bedrails?: None Help needed moving from lying on your back to sitting on the side of a flat bed without using bedrails?: None Help needed moving to and from a bed to a chair (including a wheelchair)?: None Help needed standing up from a chair using your arms (e.g., wheelchair or bedside chair)?: None Help needed to walk in hospital room?: None Help needed climbing 3-5 steps with a railing? : None 6 Click Score: 24    End of Session   Activity Tolerance: Patient tolerated treatment well Patient left: in bed;with family/visitor present;with call bell/phone within reach Nurse Communication: Mobility status PT Visit Diagnosis: Muscle weakness (generalized) (M62.81);Unsteadiness on feet (R26.81)    Time: 8416-6063 PT Time Calculation (min) (ACUTE ONLY): 21 min   Charges:   PT Evaluation $PT Eval Low Complexity: 1 Low          4:04 PM, 10/02/21 M. Shary Decamp, PT, DPT Physical Therapist- [place] Office Number: [phone]

## 2021-10-02 NOTE — ED Notes (Signed)
Pt given 4 oz of apple juice- Dr Pilar Plate aware

## 2021-10-02 NOTE — ED Notes (Signed)
Patient having episode of confusion and in ability to speak.  Patient able to follow simple commands but having mild shaking all over.  Husband reports she was talking to her boss and then just zoned out and not recognizing him or her grandson via photos. CBG 103 MD at bedside to evaluate.

## 2021-10-02 NOTE — Progress Notes (Signed)
Inpatient Diabetes Program Recommendations  AACE/ADA: New Consensus Statement on Inpatient Glycemic Control  Target Ranges:  Prepandial:   less than 140 mg/dL      Peak postprandial:   less than 180 mg/dL (1-2 hours)      Critically ill patients:  140 - 180 mg/dL    Latest Reference Range & Units 10/02/21 03:50 10/02/21 04:24 10/02/21 05:33 10/02/21 08:13  Glucose-Capillary 70 - 99 mg/dL 30 (LL) 269 (H) 485 (H) 103 (H)    Review of Glycemic Control  Diabetes history: DM2 Outpatient Diabetes medications: Toujeo 32 units BID, Humalog 10-15 units QID, Glipizide XL 10 mg daily Current orders for Inpatient glycemic control: Novolog 0-6 units TID with meals  NOTE: Spoke with patient over the phone about diabetes and home regimen for diabetes control. Patient reports being followed by PCP for diabetes management and currently taking Toujeo 32 units BID, Humalog 10-15 units QID with meals, and Glipizide XL 10 mg daily as an outpatient for diabetes control. Patient reports that she started having nausea and vomiting on Tuesday this week so she has been adjusting her Toujeo and Humalog. Patient reports that she has been taking Toujeo 16 units BID and that she took the Toujeo 16 units BID on 10/01/21. Discussed duration of Toujeo (up to 36 hours) and how it is usually only taken once a day. Patient states she has been on the same dose of Toujeo, Humalog and Glipizide for a long time and tolerated it without any issues. Patient denies any issues with hypoglycemia usually and states she usually gets symptoms of hypoglycemia when her glucose gets down in the 50's mg/dl. Patient states that she did not wake up to any symptoms of hypoglycemia during the night.  Patient reports that when she is feeling well and taking full dose of all DM medications that her fasting is 115-120's mg/dl and 2 hours post prandial is usually in 150's mg/dl.  Encouraged patient to frequently monitor glucose at home and to reach out to  PCP if she has any issues with hypoglycemia as her DM medications may need to be adjusted. Discussed FreeStyle Libre2 CGM and encouraged patient to talk with her PCP about prescribing if she is interested in using it as it will alarm if glucose is less than 70 mg/dl.   Patient verbalized understanding of information discussed and reports no further questions at this time related to diabetes. Sent chat message to [place], RN to ask if CBG could be checked since last CBG was 103 mg/dl at 4:62 am today.  Thanks, Orlando Penner, RN, MSN, CDE Diabetes Coordinator Inpatient Diabetes Program [phone] (Team Pager)

## 2021-10-02 NOTE — Progress Notes (Signed)
Neurology brief note  Called by Dr. Deretha Emory about this 55 yo woman who had a spell in ED where she was poorly responsive and nonverbal for approx 15 min. I contacted the EEG tech at [facility] who will be able to come perform an EEG at [facility] this afternoon. I recommended the following:  - Admission to hospitalist service - Routine EEG (I ordered) - Consult to Dr. Melynda Ripple teleneurology - She will likely need repeat MRI brain, this time with and without contrast; original scan was motion-degraded. Hold off on ordering this until rEEG is done and teleneurology has weighed in  Bing Neighbors, MD [facility] [phone]  If 7pm- 7am, please page neurology on call as listed in AMION.

## 2021-10-02 NOTE — Assessment & Plan Note (Signed)
-  2 episodes of unresponsiveness, staring..  Last month with this in ED about 15 minutes, -Absence seizure versus TIA -Mentation back to baseline -Possibly exacerbated by episodes of hypoglycemia but last episode blood sugar was 103 -Telemetry neurology consulted -CT of the head, MRI of the brain was reviewed, MRI had some muscle degradation, neurology recommending repeat MRI once patient is more stable -Teleneurology Dr. Selina Cooley was consulted, patient will be followed by Dr. Melynda Ripple -As needed Ativan -Monitoring electrolytes, blood sugars labs

## 2021-10-02 NOTE — Progress Notes (Signed)
EEG completed, results pending. 

## 2021-10-02 NOTE — H&P (Signed)
History and Physical   Patient: Elizabeth Jordan                            PCP: Pablo Lawrence, NP                    DOB: [birth year]            DOA: 10/02/2021 OE:[id number]             DOS: 10/02/2021, 1:00 PM  Pablo Lawrence, NP  Patient coming from:   HOME  I have personally reviewed patient's medical records, in electronic medical records, including:  [place] [place] link, and care everywhere.    Chief Complaint:   Chief Complaint  Patient presents with   Hypoglycemia    History of present illness:    Elizabeth Jordan is a 55 y.o. female with medical history significant of DM II -insulin-dependent, CKD 3A, hyperlipidemia, mood disorder, hypothyroidism, presented to ED with chief complaint of hypoglycemia and altered mental status.  Per ED report blood sugars this morning was as low as 30s.  Apparently had an unknown responsive episode. Reportedly was found by her husband on the ground.  Husband gave her sugar gel.    Episode once again happened in ED with blood sugars this time was 103.  Episode was described as patient was not responding to verbal command for about 15 minutes staring. Patient has had an MRI, and a CT of the head which was negative Case was discussed with on-call telemetry neurology Dr. Quinn Axe who has ordered an EEG,  Also recommending repeat MRI without contrast since initial 1 had motion degradation.  Patient is currently back to baseline..   Patient Denies having: Fever, Chills, Cough, SOB, Chest Pain, Abd pain, N/V/D, headache, dizziness, lightheadedness,  Dysuria, Joint pain, rash, open wounds  ED Course:   Blood pressure 138/69, pulse 92, temperature 99.2 F (37.3 C), temperature source Oral, resp. rate 13, height 5\' 8"  (1.727 m), weight 113.4 kg, last menstrual period 12/28/2011, SpO2 98 %.  Abnormal labs;     Assessment / Plan:   Principal Problem:   Altered mental status Active Problems:   Type 2 diabetes mellitus with hypoglycemia without coma  (HCC)   Seizures (HCC)   Body mass index (BMI) of 39.0-39.9 in adult   Hypothyroidism   Mixed hyperlipidemia   Type 2 diabetes mellitus with stage 3a chronic kidney disease, with long-term current use of insulin (HCC)  Principal Problem:  Encephalopathy -brief altered mental status -seizures versus TIA -2 episodes of unresponsiveness, staring..  Last month with this in ED about 15 minutes, -Absence seizure versus TIA -Mentation back to baseline -Possibly exacerbated by episodes of hypoglycemia but last episode blood sugar was 103 -Telemetry neurology consulted -CT of the head, MRI of the brain was reviewed, MRI had some muscle degradation, neurology recommending repeat MRI once patient is more stable -Teleneurology Dr. Quinn Axe was consulted, patient will be followed by Dr. Hortense Ramal -As needed Ativan -Monitoring electrolytes, blood sugars labs  Type 2 diabetes mellitus with hypoglycemia without coma ([place]) -Blood sugar recording earlier this morning in 30s, with the recurrent episodes of altered mental status blood sugar was 103 -Last A1c -Repeating A1c -With holding home medication of Glucotrol, and TOUJEO  -We will monitor CBGs every 4 hours, every 6 hours with SSI coverage  Active Problems:  Hypothyroidism  -TSH 8.843 elevated checking free T3 free T4 -Resuming home medication of  Synthroid  Body mass index (BMI) of 39.0-39.9 in adult Body mass index is 38.01 kg/m. -Optimizing underlying medical conditions, including hypothyroidism,  recommending close follow-up with PCP regarding active plan for weight loss -Recommending healthier diet diabetic diet increase activity   Mixed hyperlipidemia -Continue statins,  Stage 3a chronic kidney disease,  -Avoiding nephrotoxins -Gentle IV fluid hydration -Monitoring BUN/creatinine closely    Cultures:  -none  Antimicrobial: -none   Consults called:  Neurology   -------------------------------------------------------------------------------------------------------------------------------------------- DVT prophylaxis: SCD/Compression stockings and Heparin SQ Code Status:   Code Status: Full Code   Admission status: Patient will be admitted as Inpatient, with a greater than 2 midnight length of stay. Level of care: Telemetry   Family Communication:  none at bedside  (The above findings and plan of care has been discussed with patient in detail, the patient expressed understanding and agreement of above plan)  --------------------------------------------------------------------------------------------------------------------------------------------------  Disposition Plan:  Anticipated 1-2 days Status is: Inpatient  Remains inpatient appropriate because: Needing very close observation on telemetry, seizure evaluation, EEG and further neurological work-up    Review of Systems: As per HPI, otherwise 10 point review of systems were negative.   ----------------------------------------------------------------------------------------------------------------------  No Known Allergies  Home MEDs:  Prior to Admission medications   Medication Sig Start Date End Date Taking? Authorizing Provider  glipiZIDE (GLUCOTROL XL) 10 MG 24 hr tablet Take 10 mg by mouth daily. 07/20/21  Yes [provider]  HUMALOG KWIKPEN 100 UNIT/ML KwikPen Inject 10-15 Units into the skin 4 (four) times daily. 07/11/21  Yes [provider]  levothyroxine (SYNTHROID) 150 MCG tablet Take 150 mcg by mouth daily. 09/24/21  Yes [provider]  lisinopril (PRINIVIL,ZESTRIL) 2.5 MG tablet Take 2.5 mg by mouth daily. 12/06/15  Yes [provider]  TOUJEO MAX SOLOSTAR 300 UNIT/ML Solostar Pen Inject 32 Units into the skin 2 (two) times daily. 09/12/21  Yes [provider]  atorvastatin (LIPITOR) 10 MG tablet Take 10 mg by mouth every  morning. Patient not taking: Reported on 10/02/2021 12/06/15   [provider]  torsemide (DEMADEX) 100 MG tablet TAKE 1/2 TABLET BY MOUTH DAILY AS NEEDED FOR SWELLING Patient not taking: Reported on 10/02/2021 11/04/17   [provider]    PRN MEDs: sodium chloride, acetaminophen **OR** acetaminophen, bisacodyl, hydrALAZINE, HYDROmorphone (DILAUDID) injection, ipratropium, levalbuterol, LORazepam, ondansetron **OR** ondansetron (ZOFRAN) IV, oxyCODONE, senna-docusate, sodium chloride flush, traZODone  Past Medical History:  Diagnosis Date   Chronic kidney disease    Diabetes mellitus    Hyperlipidemia    Mood disorder (HCC)    Thyroid disease    no longer taking medication    Past Surgical History:  Procedure Laterality Date   CESAREAN SECTION     x2   CESAREAN SECTION     x 2     reports that she has never smoked. She uses smokeless tobacco. She reports that she does not drink alcohol and does not use drugs.   Family History  Problem Relation Age of Onset   Colon polyps Father    Colon cancer Neg Hx    Esophageal cancer Neg Hx    Rectal cancer Neg Hx    Stomach cancer Neg Hx     Physical Exam:   Vitals:   10/02/21 1050 10/02/21 1130 10/02/21 1200 10/02/21 1230  BP: (!) 154/72 (!) 155/79 (!) 154/84 137/65  Pulse: 97 87 87 90  Resp: 16 16 14 16   Temp:      TempSrc:  SpO2: 100% 98% 96% 99%  Weight:      Height:       Constitutional: NAD, calm, comfortable Eyes: PERRL, lids and conjunctivae normal ENMT: Mucous membranes are moist. Posterior pharynx clear of any exudate or lesions.Normal dentition.  Neck: normal, supple, no masses, no thyromegaly Respiratory: clear to auscultation bilaterally, no wheezing, no crackles. Normal respiratory effort. No accessory muscle use.  Cardiovascular: Regular rate and rhythm, no murmurs / rubs / gallops. No extremity edema. 2+ pedal pulses. No carotid bruits.  Abdomen: no tenderness, no masses palpated. No  hepatosplenomegaly. Bowel sounds positive.  Musculoskeletal: no clubbing / cyanosis. No joint deformity upper and lower extremities. Good ROM, no contractures. Normal muscle tone.  Neurologic: CN II-XII grossly intact. Sensation intact, DTR normal. Strength 5/5 in all 4.  Psychiatric: Normal judgment and insight. Alert and oriented x 3. Normal mood.  Skin: no rashes, lesions, ulcers. No induration Decubitus/ulcers:  Wounds: per nursing documentation     Labs on admission:    I have personally reviewed following labs and imaging studies  CBC: Recent Labs  Lab 10/02/21 0503  WBC 6.1  HGB 11.9*  HCT 39.0  MCV 94.2  PLT 123XX123   Basic Metabolic Panel: Recent Labs  Lab 10/02/21 0503  NA 143  K 3.7  CL 110  CO2 24  GLUCOSE 125*  BUN 20  CREATININE 1.20*  CALCIUM 8.6*  MG 2.1   GFR: Estimated Creatinine Clearance: 70 mL/min (A) (by C-G formula based on SCr of 1.2 mg/dL (H)). Liver Function Tests: Recent Labs  Lab 10/02/21 0503  AST 23  ALT 18  ALKPHOS 90  BILITOT 0.6  PROT 6.5  ALBUMIN 3.5   Recent Labs  Lab 10/02/21 0503  LIPASE 28    CBG: Recent Labs  Lab 10/02/21 0350 10/02/21 0424 10/02/21 0533 10/02/21 0813  GLUCAP 30* 124* 105* 103*   Lipid Profile: No results for input(s): CHOL, HDL, LDLCALC, TRIG, CHOLHDL, LDLDIRECT in the last 72 hours. Thyroid Function Tests: Recent Labs    10/02/21 0827  TSH 8.843*   Anemia Panel: No results for input(s): VITAMINB12, FOLATE, FERRITIN, TIBC, IRON, RETICCTPCT in the last 72 hours. Urine analysis:    Component Value Date/Time   COLORURINE YELLOW 10/02/2021 1029   APPEARANCEUR CLEAR 10/02/2021 1029   LABSPEC >1.030 (H) 10/02/2021 1029   PHURINE 6.0 10/02/2021 1029   GLUCOSEU NEGATIVE 10/02/2021 1029   HGBUR MODERATE (A) 10/02/2021 1029   BILIRUBINUR NEGATIVE 10/02/2021 1029   KETONESUR NEGATIVE 10/02/2021 1029   PROTEINUR >300 (A) 10/02/2021 1029   UROBILINOGEN 0.2 09/06/2013 0400   NITRITE  NEGATIVE 10/02/2021 1029   LEUKOCYTESUR NEGATIVE 10/02/2021 1029     Radiologic Exams on Admission:   CT Head Wo Contrast  Result Date: 10/02/2021 CLINICAL DATA:  Mental status change, unknown cause EXAM: CT HEAD WITHOUT CONTRAST TECHNIQUE: Contiguous axial images were obtained from the base of the skull through the vertex without intravenous contrast. COMPARISON:  None. FINDINGS: Brain: No evidence of acute infarction, hemorrhage, hydrocephalus, extra-axial collection or mass lesion/mass effect. Vascular: No hyperdense vessel or unexpected calcification. Skull: No osseous abnormality. Sinuses/Orbits: Visualized paranasal sinuses are clear. Visualized mastoid sinuses are clear. Visualized orbits demonstrate no focal abnormality. Other: None IMPRESSION: No acute intracranial pathology. Electronically Signed   By: Kathreen Devoid M.D.   On: 10/02/2021 08:51   MR Brain Wo Contrast (neuro protocol)  Result Date: 10/02/2021 CLINICAL DATA:  TIA EXAM: MRI HEAD WITHOUT CONTRAST TECHNIQUE: Multiplanar, multiecho pulse sequences of  the brain and surrounding structures were obtained without intravenous contrast. COMPARISON:  CT head 10/02/2021 FINDINGS: Brain: Limited study. The patient was not able to complete the study. Available images are degraded by motion. Negative for acute infarct. No significant chronic ischemia. Ventricle size normal. No fluid collection or mass. Vascular: Normal arterial flow voids at the skull base. Skull and upper cervical spine: No focal abnormality. Sinuses/Orbits: Mild mucosal edema paranasal sinuses. Negative orbit Other: None IMPRESSION: Limited study.  No acute abnormality. Electronically Signed   By: Franchot Gallo M.D.   On: 10/02/2021 11:04   DG Chest Port 1 View  Result Date: 10/02/2021 CLINICAL DATA:  55 year old female with hypoglycemia, fell out of bed. EXAM: PORTABLE CHEST 1 VIEW COMPARISON:  None. FINDINGS: Portable AP upright view at 0427 hours. Lung volumes and  mediastinal contours are within normal limits. Visualized tracheal air column is within normal limits. Allowing for portable technique the lungs are clear. No pneumothorax or pleural effusion. No acute osseous abnormality identified. Paucity of bowel gas in the upper abdomen. IMPRESSION: Negative portable chest. Electronically Signed   By: Genevie Ann M.D.   On: 10/02/2021 04:42    EKG:   Independently reviewed.  Orders placed or performed during the hospital encounter of 10/02/21   EKG 12-Lead   ---------------------------------------------------------------------------------------------------------------------------------------     -----------------------------------------------------------------------------------------------------------------------------------------  Time spent: > than  87  Min.   SIGNED: Deatra James, MD, FHM. [facility],  Pager (Please use [email or web address] to page to text)  If 7PM-7AM, please contact night-coverage [email or web address],  10/02/2021, 1:00 PM

## 2021-10-03 DIAGNOSIS — G9340 Encephalopathy, unspecified: Secondary | ICD-10-CM | POA: Diagnosis not present

## 2021-10-03 LAB — CBC
HCT: 31.1 % — ABNORMAL LOW (ref 36.0–46.0)
Hemoglobin: 9.4 g/dL — ABNORMAL LOW (ref 12.0–15.0)
MCH: 28.4 pg (ref 26.0–34.0)
MCHC: 30.2 g/dL (ref 30.0–36.0)
MCV: 94 fL (ref 80.0–100.0)
Platelets: 178 10*3/uL (ref 150–400)
RBC: 3.31 MIL/uL — ABNORMAL LOW (ref 3.87–5.11)
RDW: 12.8 % (ref 11.5–15.5)
WBC: 5 10*3/uL (ref 4.0–10.5)
nRBC: 0 % (ref 0.0–0.2)

## 2021-10-03 LAB — BASIC METABOLIC PANEL
Anion gap: 5 (ref 5–15)
BUN: 18 mg/dL (ref 6–20)
CO2: 23 mmol/L (ref 22–32)
Calcium: 7.8 mg/dL — ABNORMAL LOW (ref 8.9–10.3)
Chloride: 114 mmol/L — ABNORMAL HIGH (ref 98–111)
Creatinine, Ser: 1.2 mg/dL — ABNORMAL HIGH (ref 0.44–1.00)
GFR, Estimated: 53 mL/min — ABNORMAL LOW (ref >60)
Glucose, Bld: 115 mg/dL — ABNORMAL HIGH (ref 70–99)
Potassium: 4 mmol/L (ref 3.5–5.1)
Sodium: 142 mmol/L (ref 135–145)

## 2021-10-03 LAB — PROTIME-INR
INR: 1 (ref 0.8–1.2)
Prothrombin Time: 13.5 seconds (ref 11.4–15.2)

## 2021-10-03 LAB — GLUCOSE, CAPILLARY
Glucose-Capillary: 79 mg/dL (ref 70–99)
Glucose-Capillary: 107 mg/dL — ABNORMAL HIGH (ref 70–99)
Glucose-Capillary: 84 mg/dL (ref 70–99)
Glucose-Capillary: 169 mg/dL — ABNORMAL HIGH (ref 70–99)
Glucose-Capillary: 153 mg/dL — ABNORMAL HIGH (ref 70–99)

## 2021-10-03 LAB — PROLACTIN: Prolactin: 7.9 ng/mL (ref 4.8–23.3)

## 2021-10-03 LAB — T3, FREE: T3, Free: 2 pg/mL (ref 2.0–4.4)

## 2021-10-03 MED ORDER — LORAZEPAM 2 MG/ML IJ SOLN
1.0000 mg | Freq: Once | INTRAMUSCULAR | Status: DC
  Filled 2021-10-03: qty 1

## 2021-10-03 MED ORDER — ATORVASTATIN CALCIUM 10 MG PO TABS: 10.0000 mg | ORAL_TABLET | Freq: Every evening | ORAL | 0 refills | Status: AC

## 2021-10-03 NOTE — Progress Notes (Signed)
Nsg Discharge Note  Admit Date:  10/02/2021 Discharge date: 10/03/2021   Elizabeth Jordan to be D/C'd Home per MD order.  AVS completed.  Patient/caregiver able to verbalize understanding.  Discharge Medication: Allergies as of 10/03/2021   No Known Allergies      Medication List     STOP taking these medications    glipiZIDE 10 MG 24 hr tablet Commonly known as: GLUCOTROL XL   torsemide 100 MG tablet Commonly known as: DEMADEX   Toujeo Max SoloStar 300 UNIT/ML Solostar Pen Generic drug: insulin glargine (2 Unit Dial)       TAKE these medications    atorvastatin 10 MG tablet Commonly known as: LIPITOR Take 1 tablet (10 mg total) by mouth every evening. What changed: when to take this   HumaLOG KwikPen 100 UNIT/ML KwikPen Generic drug: insulin lispro Inject 10-15 Units into the skin 4 (four) times daily.   levothyroxine 150 MCG tablet Commonly known as: SYNTHROID Take 150 mcg by mouth daily.   lisinopril 2.5 MG tablet Commonly known as: ZESTRIL Take 2.5 mg by mouth daily.        Discharge Assessment: Vitals:   10/03/21 0419 10/03/21 0843  BP: (!) 111/49 (!) 144/66  Pulse: 75   Resp: 18   Temp: 98.6 F (37 C)   SpO2: 98%    Skin clean, dry and intact without evidence of skin break down, no evidence of skin tears noted. IV catheter discontinued intact. Site without signs and symptoms of complications - no redness or edema noted at insertion site, patient denies c/o pain - only slight tenderness at site.  Dressing with slight pressure applied.  D/c Instructions-Education: Discharge instructions given to patient/family with verbalized understanding. D/c education completed with patient/family including follow up instructions, medication list, d/c activities limitations if indicated, with other d/c instructions as indicated by MD - patient able to verbalize understanding, all questions fully answered. Patient instructed to return to ED, call 911, or call MD  for any changes in condition.  Patient escorted via WC, and D/C home via private auto.  Verl Dicker, RN 10/03/2021 4:33 PM

## 2021-10-03 NOTE — Progress Notes (Signed)
PROGRESS NOTE    Patient: Elizabeth Jordan                            PCP: Pablo Lawrence, NP                    DOB: 09/26/1966            DOA: 10/02/2021 [id number]             DOS: 10/03/2021, 11:19 AM   LOS: 1 day   Date of Service: The patient was seen and examined on 10/03/2021  Subjective:   The patient was seen and examined this morning. Stable at this time. Hemodynamically stable, still multiple episodes of hypoglycemia on D5 normal saline CBG this morning 107, 84  Brief Narrative:   Elizabeth Jordan is a 55 y.o. female with medical history significant of DM II -insulin-dependent, CKD 3A, hyperlipidemia, mood disorder, hypothyroidism, presented to ED with chief complaint of hypoglycemia and altered mental status.   Per ED report blood sugars this morning was as low as 30s.  Apparently had an unknown responsive episode. Reportedly was found by her husband on the ground.  Husband gave her sugar gel.     Episode once again happened in ED with blood sugars this time was 103.  Episode was described as patient was not responding to verbal command for about 15 minutes staring. Patient has had an MRI, and a CT of the head which was negative Case was discussed with on-call telemetry neurology Dr. Quinn Axe who has ordered an EEG,  Also recommending repeat MRI without contrast since initial 1 had motion degradation.   Patient is currently back to baseline..    Patient Denies having: Fever, Chills, Cough, SOB, Chest Pain, Abd pain, N/V/D, headache, dizziness, lightheadedness,  Dysuria, Joint pain, rash, open wounds   ED Course:   Blood pressure 138/69, pulse 92, temperature 99.2 F (37.3 C), temperature source Oral, resp. rate 13, height 5\' 8"  (1.727 m), weight 113.4 kg, last menstrual period 12/28/2011, SpO2 98 %.   Labs; reviewed, WNL      Assessment / Plan:    Principal Problem:   Altered mental status Active Problems:   Type 2 diabetes mellitus with hypoglycemia without  coma (HCC)   Seizures (HCC)   Body mass index (BMI) of 39.0-39.9 in adult   Hypothyroidism   Mixed hyperlipidemia   Type 2 diabetes mellitus with stage 3a chronic kidney disease, with long-term current use of insulin (HCC)   Principal Problem:   Encephalopathy -brief altered mental status -seizures versus TIA -Mentation back to baseline, and no further episodes overnight  -2 episodes of unresponsiveness, staring..  Last one in ED about 15 minutes, -Absence seizure versus TIA -Mentation back to baseline -Possibly exacerbated by episodes of hypoglycemia but last episode blood sugar was 103 -Telemetry neurology consulted -CT of the head, MRI of the brain was reviewed, MRI had some muscle degradation, neurology recommending repeat MRI once patient is more stable -Teleneurology Dr. Quinn Axe was consulted, patient will be followed by Dr. Hortense Ramal -As needed Ativan -Monitoring electrolytes, blood sugars labs  -Seizures rule out EEG negative   Obtaining repeat MRI today per neurology recommendation, will be given Valium prior to MRI as patient is claustrophobic...     Type 2 diabetes mellitus with hypoglycemia without coma (HCC) -Blood sugar recording earlier this morning in 30s, with the recurrent episodes of altered mental status blood sugar  was 103 -A1c :8.9 -With holding home medication of Glucotrol, and TOUJEO  -We will monitor CBGs every 4 hours, every 6 hours with SSI coverage   Active Problems:   Hypothyroidism  -TSH 8.843 elevated, free T4 normal at 0.74 -Resuming home medication of Synthroid   Body mass index (BMI) of 39.0-39.9 in adult Body mass index is 38.01 kg/m. -Optimizing underlying medical conditions, including hypothyroidism,  recommending close follow-up with PCP regarding active plan for weight loss -Recommending healthier diet diabetic diet increase activity     Mixed hyperlipidemia -Continue statins,   Stage 3a chronic kidney disease,  -Avoiding  nephrotoxins -Gentle IV fluid hydration -Monitoring BUN/creatinine closely     Cultures:  -none  Antimicrobial: -none    Consults called:  Neurology  -------------------------------------------------------------------------------------------------------------------------------------------- DVT prophylaxis: SCD/Compression stockings and Heparin SQ Code Status:   Code Status: Full Code     Admission status: Patient will be admitted as Inpatient, with a greater than 2 midnight length of stay. Level of care: Telemetry     Family Communication: Discussed with husband at bedside-updated (The above findings and plan of care has been discussed with patient in detail, the patient expressed understanding and agreement of above plan)  --------------------------------------------------------------------------------------------------------------------------------------------------   Disposition Plan:  Anticipated 1-2 days Patient will remain in the hospital under close observation due to hypoglycemia still requiring D5 normal saline blood sugars running low, also recommended MRI per neurology will be completed today      Status is: Inpatient   Level of care: Telemetry   Procedures:   No admission procedures for hospital encounter.    Antimicrobials:  Anti-infectives (From admission, onward)    None        Medication:   atorvastatin  10 mg Oral QPM   heparin  5,000 Units Subcutaneous Q8H   insulin aspart  0-6 Units Subcutaneous TID WC   levothyroxine  150 mcg Oral Daily   lisinopril  2.5 mg Oral Daily   LORazepam  1 mg Intravenous Once   sodium chloride flush  3 mL Intravenous Q12H   sodium chloride flush  3 mL Intravenous Q12H    sodium chloride, acetaminophen **OR** acetaminophen, bisacodyl, hydrALAZINE, HYDROmorphone (DILAUDID) injection, ipratropium, levalbuterol, LORazepam, ondansetron **OR** ondansetron (ZOFRAN) IV, oxyCODONE, senna-docusate, sodium chloride  flush, traZODone   Objective:   Vitals:   10/02/21 2108 10/03/21 0419 10/03/21 0432 10/03/21 0843  BP: 102/63 (!) 111/49  (!) 144/66  Pulse: 83 75    Resp: 18 18    Temp: 99.6 F (37.6 C) 98.6 F (37 C)    TempSrc:  Oral    SpO2: 97% 98%    Weight:   114 kg   Height:        Intake/Output Summary (Last 24 hours) at 10/03/2021 1119 Last data filed at 10/03/2021 0900 Gross per 24 hour  Intake 1888.65 ml  Output 850 ml  Net 1038.65 ml   Filed Weights   10/02/21 0351 10/02/21 1522 10/03/21 0432  Weight: 113.4 kg 110.5 kg 114 kg     Examination:   Physical Exam  Constitution:  Alert, cooperative, no distress,  Appears calm and comfortable  Psychiatric: Normal and stable mood and affect, cognition intact,   HEENT: Normocephalic, PERRL, otherwise with in Normal limits  Chest:Chest symmetric Cardio vascular:  S1/S2, RRR, No murmure, No Rubs or Gallops  pulmonary: Clear to auscultation bilaterally, respirations unlabored, negative wheezes / crackles Abdomen: Soft, non-tender, non-distended, bowel sounds,no masses, no organomegaly Muscular skeletal: Limited exam - in bed,  able to move all 4 extremities, Normal strength,  Neuro: CNII-XII intact. , normal motor and sensation, reflexes intact  Extremities: No pitting edema lower extremities, +2 pulses  Skin: Dry, warm to touch, negative for any Rashes, No open wounds Wounds: per nursing documentation    ------------------------------------------------------------------------------------------------------------------------------------------    LABs:  CBC Latest Ref Rng & Units 10/03/2021 10/02/2021 10/02/2021  WBC 4.0 - 10.5 K/uL 5.0 6.0 6.1  Hemoglobin 12.0 - 15.0 g/dL 9.4(L) 11.2(L) 11.9(L)  Hematocrit 36.0 - 46.0 % 31.1(L) 35.7(L) 39.0  Platelets 150 - 400 K/uL 178 209 185   CMP Latest Ref Rng & Units 10/03/2021 10/02/2021 10/02/2021  Glucose 70 - 99 mg/dL 115(H) - 125(H)  BUN 6 - 20 mg/dL 18 - 20  Creatinine 0.44 - 1.00  mg/dL 1.20(H) 1.14(H) 1.20(H)  Sodium 135 - 145 mmol/L 142 - 143  Potassium 3.5 - 5.1 mmol/L 4.0 - 3.7  Chloride 98 - 111 mmol/L 114(H) - 110  CO2 22 - 32 mmol/L 23 - 24  Calcium 8.9 - 10.3 mg/dL 7.8(L) - 8.6(L)  Total Protein 6.5 - 8.1 g/dL - - 6.5  Total Bilirubin 0.3 - 1.2 mg/dL - - 0.6  Alkaline Phos 38 - 126 U/L - - 90  AST 15 - 41 U/L - - 23  ALT 0 - 44 U/L - - 18       Micro Results Recent Results (from the past 240 hour(s))  Resp Panel by RT-PCR (Flu A&B, Covid) Nasopharyngeal Swab     Status: None   Collection Time: 10/02/21 12:49 PM   Specimen: Nasopharyngeal Swab; Nasopharyngeal(NP) swabs in vial transport medium  Result Value Ref Range Status   SARS Coronavirus 2 by RT PCR NEGATIVE NEGATIVE Final    Comment: (NOTE) SARS-CoV-2 target nucleic acids are NOT DETECTED.  The SARS-CoV-2 RNA is generally detectable in upper respiratory specimens during the acute phase of infection. The lowest concentration of SARS-CoV-2 viral copies this assay can detect is 138 copies/mL. A negative result does not preclude SARS-Cov-2 infection and should not be used as the sole basis for treatment or other patient management decisions. A negative result may occur with  improper specimen collection/handling, submission of specimen other than nasopharyngeal swab, presence of viral mutation(s) within the areas targeted by this assay, and inadequate number of viral copies(<138 copies/mL). A negative result must be combined with clinical observations, patient history, and epidemiological information. The expected result is Negative.  Fact Sheet for Patients:  EntrepreneurPulse.com.au  Fact Sheet for Healthcare Providers:  IncredibleEmployment.be  This test is no t yet approved or cleared by the [place] FDA and  has been authorized for detection and/or diagnosis of SARS-CoV-2 by FDA under an Emergency Use Authorization (EUA). This EUA will remain   in effect (meaning this test can be used) for the duration of the COVID-19 declaration under Section 564(b)(1) of the Act, 21 U.S.C.section 360bbb-3(b)(1), unless the authorization is terminated  or revoked sooner.       Influenza A by PCR NEGATIVE NEGATIVE Final   Influenza B by PCR NEGATIVE NEGATIVE Final    Comment: (NOTE) The Xpert Xpress SARS-CoV-2/FLU/RSV plus assay is intended as an aid in the diagnosis of influenza from Nasopharyngeal swab specimens and should not be used as a sole basis for treatment. Nasal washings and aspirates are unacceptable for Xpert Xpress SARS-CoV-2/FLU/RSV testing.  Fact Sheet for Patients: EntrepreneurPulse.com.au  Fact Sheet for Healthcare Providers: IncredibleEmployment.be  This test is not yet approved or cleared by the [place]  States FDA and has been authorized for detection and/or diagnosis of SARS-CoV-2 by FDA under an Emergency Use Authorization (EUA). This EUA will remain in effect (meaning this test can be used) for the duration of the COVID-19 declaration under Section 564(b)(1) of the Act, 21 U.S.C. section 360bbb-3(b)(1), unless the authorization is terminated or revoked.  Performed at [facility], [address], [place], [place] [address]     Radiology Reports CT Head Wo Contrast  Result Date: 10/02/2021 CLINICAL DATA:  Mental status change, unknown cause EXAM: CT HEAD WITHOUT CONTRAST TECHNIQUE: Contiguous axial images were obtained from the base of the skull through the vertex without intravenous contrast. COMPARISON:  None. FINDINGS: Brain: No evidence of acute infarction, hemorrhage, hydrocephalus, extra-axial collection or mass lesion/mass effect. Vascular: No hyperdense vessel or unexpected calcification. Skull: No osseous abnormality. Sinuses/Orbits: Visualized paranasal sinuses are clear. Visualized mastoid sinuses are clear. Visualized orbits demonstrate no focal abnormality. Other:  None IMPRESSION: No acute intracranial pathology. Electronically Signed   By: Kathreen Devoid M.D.   On: 10/02/2021 08:51   MR Brain Wo Contrast (neuro protocol)  Result Date: 10/02/2021 CLINICAL DATA:  TIA EXAM: MRI HEAD WITHOUT CONTRAST TECHNIQUE: Multiplanar, multiecho pulse sequences of the brain and surrounding structures were obtained without intravenous contrast. COMPARISON:  CT head 10/02/2021 FINDINGS: Brain: Limited study. The patient was not able to complete the study. Available images are degraded by motion. Negative for acute infarct. No significant chronic ischemia. Ventricle size normal. No fluid collection or mass. Vascular: Normal arterial flow voids at the skull base. Skull and upper cervical spine: No focal abnormality. Sinuses/Orbits: Mild mucosal edema paranasal sinuses. Negative orbit Other: None IMPRESSION: Limited study.  No acute abnormality. Electronically Signed   By: Franchot Gallo M.D.   On: 10/02/2021 11:04   DG Chest Port 1 View  Result Date: 10/02/2021 CLINICAL DATA:  55 year old female with hypoglycemia, fell out of bed. EXAM: PORTABLE CHEST 1 VIEW COMPARISON:  None. FINDINGS: Portable AP upright view at 0427 hours. Lung volumes and mediastinal contours are within normal limits. Visualized tracheal air column is within normal limits. Allowing for portable technique the lungs are clear. No pneumothorax or pleural effusion. No acute osseous abnormality identified. Paucity of bowel gas in the upper abdomen. IMPRESSION: Negative portable chest. Electronically Signed   By: Genevie Ann M.D.   On: 10/02/2021 04:42   EEG adult  Result Date: 10/02/2021 Lora Havens, MD     10/02/2021  4:33 PM Patient Name: Elizabeth Jordan MRN: [id number] Epilepsy Attending: Lora Havens Referring Physician/Provider: Dr Su Monks Date: 10/02/2021 Duration: 22.23 mins Patient history: 55 yo woman who had a spell in ED where she was poorly responsive and nonverbal for approx 15 min. EEG to evaluate  for seizure Level of alertness: Awake, asleep AEDs during EEG study: None Technical aspects: This EEG study was done with scalp electrodes positioned according to the 10-20 International system of electrode placement. Electrical activity was acquired at a sampling rate of 500Hz  and reviewed with a high frequency filter of 70Hz  and a low frequency filter of 1Hz . EEG data were recorded continuously and digitally stored. Description: The posterior dominant rhythm consists of 8 Hz activity of moderate voltage (25-35 uV) seen predominantly in posterior head regions, symmetric and reactive to eye opening and eye closing. Sleep was characterized by vertex waves, sleep spindles (12 to 14 Hz), maximal frontocentral region.  Hyperventilation and photic stimulation were not performed.   IMPRESSION: This study is within normal  limits. No seizures or epileptiform discharges were seen throughout the recording. Charlsie Quest    SIGNED: Kendell Bane, MD, FHM. [facility],  Pager (please use [email or web address] to page/text) Please use Epic Secure Chat for non-urgent communication (7AM-7PM)  If 7PM-7AM, please contact night-coverage [email or web address], 10/03/2021, 11:19 AM

## 2021-10-03 NOTE — Discharge Summary (Signed)
Physician Discharge Summary [facility]    Patient: Elizabeth Jordan                   Admit date: 10/02/2021   DOB: May 30, 1966             Discharge date:10/03/2021/4:24 PM AB:[id number]                          PCP: Pablo Lawrence, NP  Disposition:   HOME    Recommendations for Outpatient Follow-up:   Follow up: PCP within 1 week, Holding long-acting insulin, continue with Humalog sliding scale for now  Discharge Condition: Stable   Code Status:   Code Status: Full Code  Diet recommendation: Diabetic diet   Discharge Diagnoses:    Principal Problem:   Encephalopathy Active Problems:   Type 2 diabetes mellitus with hypoglycemia without coma ([place])   Seizures ([place])   Hypothyroidism   Mixed hyperlipidemia   Type 2 diabetes mellitus with stage 3a chronic kidney disease, with long-term current use of insulin ([place])   Body mass index (BMI) of 39.0-39.9 in adult   History of Present Illness/ Hospital Course Kathleen Argue Summary:    Elizabeth Jordan is a 55 y.o. female with medical history significant of DM II -insulin-dependent, CKD 3A, hyperlipidemia, mood disorder, hypothyroidism, presented to ED with chief complaint of hypoglycemia and altered mental status.   Per ED report blood sugars this morning was as low as 30s.  Apparently had an unknown responsive episode. Reportedly was found by her husband on the ground.  Husband gave her sugar gel.     Episode once again happened in ED with blood sugars this time was 103.  Episode was described as patient was not responding to verbal command for about 15 minutes staring. Patient has had an MRI, and a CT of the head which was negative Case was discussed with on-call telemetry neurology Dr. Quinn Axe who has ordered an EEG,  Also recommending repeat MRI without contrast since initial 1 had motion degradation.   Patient is currently back to baseline..    Patient Denies having: Fever, Chills, Cough, SOB, Chest Pain, Abd pain, N/V/D,  headache, dizziness, lightheadedness,  Dysuria, Joint pain, rash, open wounds   ED Course:   Blood pressure 138/69, pulse 92, temperature 99.2 F (37.3 C), temperature source Oral, resp. rate 13, height 5\' 8"  (1.727 m), weight 113.4 kg, last menstrual period 12/28/2011, SpO2 98 %.   Labs; reviewed, WNL      Encephalopathy -brief altered mental status -seizures versus TIA -Mentation back to baseline, and no further episodes overnight   -2 episodes of unresponsiveness, staring..  Last one in ED about 15 minutes, -Absence seizure versus TIA -Mentation back to baseline -Possibly exacerbated by episodes of hypoglycemia but last episode blood sugar was 103 -Telemetry neurology consulted -CT of the head, MRI of the brain was reviewed, MRI had some muscle degradation, neurology recommending repeat MRI once patient is more stable -Teleneurology Dr. Quinn Axe was consulted, patient will be followed by Dr. Hortense Ramal -As needed Ativan -Monitoring electrolytes, blood sugars labs   -Seizures rule out EEG negative   We cannot obtain a repeat MRI as there is no MRI available at [facility] over the weekend Patient has no focal neurological findings, mentation is back to baseline     Type 2 diabetes mellitus with hypoglycemia without coma ([place]) -Blood sugar recording earlier this morning in 30s, with the recurrent episodes of  altered mental status blood sugar was 103 -A1c :8.9 -With holding home medication of Glucotrol, and TOUJEO  -We will monitor CBGs every 4 hours, every 6 hours with SSI coverage Instructed patient to resume Humalog sliding scale insulin for blood sugars improve (He is to contact her PCP regarding restarting Glucotrol and TOUJEO)   Active Problems:   Hypothyroidism  -TSH 8.843 elevated, free T4 normal at 0.74 -Resuming home medication of Synthroid   Body mass index (BMI) of 39.0-39.9 in adult Body mass index is 38.01 kg/m. -Optimizing underlying medical conditions, including  hypothyroidism,  recommending close follow-up with PCP regarding active plan for weight loss -Recommending healthier diet diabetic diet increase activity     Mixed hyperlipidemia -Continue statins,   Stage 3a chronic kidney disease,  -Avoiding nephrotoxins -Gentle IV fluid hydration -Monitoring BUN/creatinine closely         Discharge Instructions:   Discharge Instructions     Activity as tolerated - No restrictions   Complete by: As directed    Diet Carb Modified   Complete by: As directed    Discharge instructions   Complete by: As directed    Continue checking your blood sugar every 4 hours / before meals continue with sliding scale insulin for now, may reintroduce glipizide and long-acting insulin as your blood sugar improved greater 150 close to 200 Contact his PCP for readjustment of few insulin regimen...   Increase activity slowly   Complete by: As directed         Medication List     STOP taking these medications    glipiZIDE 10 MG 24 hr tablet Commonly known as: GLUCOTROL XL   torsemide 100 MG tablet Commonly known as: DEMADEX   Toujeo Max SoloStar 300 UNIT/ML Solostar Pen Generic drug: insulin glargine (2 Unit Dial)       TAKE these medications    atorvastatin 10 MG tablet Commonly known as: LIPITOR Take 1 tablet (10 mg total) by mouth every evening. What changed: when to take this   HumaLOG KwikPen 100 UNIT/ML KwikPen Generic drug: insulin lispro Inject 10-15 Units into the skin 4 (four) times daily.   levothyroxine 150 MCG tablet Commonly known as: SYNTHROID Take 150 mcg by mouth daily.   lisinopril 2.5 MG tablet Commonly known as: ZESTRIL Take 2.5 mg by mouth daily.        No Known Allergies   Quit smoking:  It is highly recommended that all people especially deals with diabetes to quit smoking or stay away from smoking, avoid secondhand smoking.   Vaccines:  Also highly recommended update your vaccines requirement,  including SARS-CoV-2 , yearly  flu vaccine and pneumonia vaccine at least every 5 years.      Exercise: If you are able: 30 -60 minutes a day, 4 days a week, or 150 minutes a week.  The longer the better.  Combine stretch, strength, and aerobic activities.  If you were told in the past that you have high risk for cardiovascular diseases, you may seek evaluation by your heart doctor prior to initiating moderate to intense exercise programs.    One other important lifestyle recommendation is to ensure adequate sleep - at least 6-7 hours of uninterrupted sleep at night.  Procedures /Studies:   CT Head Wo Contrast  Result Date: 10/02/2021 CLINICAL DATA:  Mental status change, unknown cause EXAM: CT HEAD WITHOUT CONTRAST TECHNIQUE: Contiguous axial images were obtained from the base of the skull through the vertex without intravenous contrast. COMPARISON:  None. FINDINGS: Brain: No evidence of acute infarction, hemorrhage, hydrocephalus, extra-axial collection or mass lesion/mass effect. Vascular: No hyperdense vessel or unexpected calcification. Skull: No osseous abnormality. Sinuses/Orbits: Visualized paranasal sinuses are clear. Visualized mastoid sinuses are clear. Visualized orbits demonstrate no focal abnormality. Other: None IMPRESSION: No acute intracranial pathology. Electronically Signed   By: Elige Ko M.D.   On: 10/02/2021 08:51   MR Brain Wo Contrast (neuro protocol)  Result Date: 10/02/2021 CLINICAL DATA:  TIA EXAM: MRI HEAD WITHOUT CONTRAST TECHNIQUE: Multiplanar, multiecho pulse sequences of the brain and surrounding structures were obtained without intravenous contrast. COMPARISON:  CT head 10/02/2021 FINDINGS: Brain: Limited study. The patient was not able to complete the study. Available images are degraded by motion. Negative for acute infarct. No significant chronic ischemia. Ventricle size normal. No fluid collection or mass. Vascular: Normal arterial flow voids at the skull base.  Skull and upper cervical spine: No focal abnormality. Sinuses/Orbits: Mild mucosal edema paranasal sinuses. Negative orbit Other: None IMPRESSION: Limited study.  No acute abnormality. Electronically Signed   By: Marlan Palau M.D.   On: 10/02/2021 11:04   DG Chest Port 1 View  Result Date: 10/02/2021 CLINICAL DATA:  55 year old female with hypoglycemia, fell out of bed. EXAM: PORTABLE CHEST 1 VIEW COMPARISON:  None. FINDINGS: Portable AP upright view at 0427 hours. Lung volumes and mediastinal contours are within normal limits. Visualized tracheal air column is within normal limits. Allowing for portable technique the lungs are clear. No pneumothorax or pleural effusion. No acute osseous abnormality identified. Paucity of bowel gas in the upper abdomen. IMPRESSION: Negative portable chest. Electronically Signed   By: Odessa Fleming M.D.   On: 10/02/2021 04:42   EEG adult  Result Date: 10/02/2021 Charlsie Quest, MD     10/02/2021  4:33 PM Patient Name: ALLANTE BEANE MRN: [id number] Epilepsy Attending: Charlsie Quest Referring Physician/Provider: Dr Bing Neighbors Date: 10/02/2021 Duration: 22.23 mins Patient history: 55 yo woman who had a spell in ED where she was poorly responsive and nonverbal for approx 15 min. EEG to evaluate for seizure Level of alertness: Awake, asleep AEDs during EEG study: None Technical aspects: This EEG study was done with scalp electrodes positioned according to the 10-20 International system of electrode placement. Electrical activity was acquired at a sampling rate of 500Hz  and reviewed with a high frequency filter of 70Hz  and a low frequency filter of 1Hz . EEG data were recorded continuously and digitally stored. Description: The posterior dominant rhythm consists of 8 Hz activity of moderate voltage (25-35 uV) seen predominantly in posterior head regions, symmetric and reactive to eye opening and eye closing. Sleep was characterized by vertex waves, sleep spindles (12 to 14 Hz),  maximal frontocentral region.  Hyperventilation and photic stimulation were not performed.   IMPRESSION: This study is within normal limits. No seizures or epileptiform discharges were seen throughout the recording. Priyanka    Subjective:   Patient was seen and examined 10/03/2021, 4:24 PM Patient stable today. No acute distress.  No issues overnight Stable for discharge.  Discharge Exam:    Vitals:   10/02/21 2108 10/03/21 0419 10/03/21 0432 10/03/21 0843  BP: 102/63 (!) 111/49  (!) 144/66  Pulse: 83 75    Resp: 18 18    Temp: 99.6 F (37.6 C) 98.6 F (37 C)    TempSrc:  Oral    SpO2: 97% 98%    Weight:   114 kg   Height:  General: Pt lying comfortably in bed & appears in no obvious distress. Cardiovascular: S1 & S2 heard, RRR, S1/S2 +. No murmurs, rubs, gallops or clicks. No JVD or pedal edema. Respiratory: Clear to auscultation without wheezing, rhonchi or crackles. No increased work of breathing. Abdominal:  Non-distended, non-tender & soft. No organomegaly or masses appreciated. Normal bowel sounds heard. CNS: Alert and oriented. No focal deficits. Extremities: no edema, no cyanosis      The results of significant diagnostics from this hospitalization (including imaging, microbiology, ancillary and laboratory) are listed below for reference.      Microbiology:   Recent Results (from the past 240 hour(s))  Resp Panel by RT-PCR (Flu A&B, Covid) Nasopharyngeal Swab     Status: None   Collection Time: 10/02/21 12:49 PM   Specimen: Nasopharyngeal Swab; Nasopharyngeal(NP) swabs in vial transport medium  Result Value Ref Range Status   SARS Coronavirus 2 by RT PCR NEGATIVE NEGATIVE Final    Comment: (NOTE) SARS-CoV-2 target nucleic acids are NOT DETECTED.  The SARS-CoV-2 RNA is generally detectable in upper respiratory specimens during the acute phase of infection. The lowest concentration of SARS-CoV-2 viral copies this assay can detect is 138  copies/mL. A negative result does not preclude SARS-Cov-2 infection and should not be used as the sole basis for treatment or other patient management decisions. A negative result may occur with  improper specimen collection/handling, submission of specimen other than nasopharyngeal swab, presence of viral mutation(s) within the areas targeted by this assay, and inadequate number of viral copies(<138 copies/mL). A negative result must be combined with clinical observations, patient history, and epidemiological information. The expected result is Negative.  Fact Sheet for Patients:  EntrepreneurPulse.com.au  Fact Sheet for Healthcare Providers:  IncredibleEmployment.be  This test is no t yet approved or cleared by the [place] FDA and  has been authorized for detection and/or diagnosis of SARS-CoV-2 by FDA under an Emergency Use Authorization (EUA). This EUA will remain  in effect (meaning this test can be used) for the duration of the COVID-19 declaration under Section 564(b)(1) of the Act, 21 U.S.C.section 360bbb-3(b)(1), unless the authorization is terminated  or revoked sooner.       Influenza A by PCR NEGATIVE NEGATIVE Final   Influenza B by PCR NEGATIVE NEGATIVE Final    Comment: (NOTE) The Xpert Xpress SARS-CoV-2/FLU/RSV plus assay is intended as an aid in the diagnosis of influenza from Nasopharyngeal swab specimens and should not be used as a sole basis for treatment. Nasal washings and aspirates are unacceptable for Xpert Xpress SARS-CoV-2/FLU/RSV testing.  Fact Sheet for Patients: EntrepreneurPulse.com.au  Fact Sheet for Healthcare Providers: IncredibleEmployment.be  This test is not yet approved or cleared by the [place] FDA and has been authorized for detection and/or diagnosis of SARS-CoV-2 by FDA under an Emergency Use Authorization (EUA). This EUA will remain in effect (meaning  this test can be used) for the duration of the COVID-19 declaration under Section 564(b)(1) of the Act, 21 U.S.C. section 360bbb-3(b)(1), unless the authorization is terminated or revoked.  Performed at [facility], [address], [place], [place] [address]      Labs:   CBC: Recent Labs  Lab 10/02/21 0503 10/02/21 1332 10/03/21 0403  WBC 6.1 6.0 5.0  HGB 11.9* 11.2* 9.4*  HCT 39.0 35.7* 31.1*  MCV 94.2 93.0 94.0  PLT 185 209 0000000   Basic Metabolic Panel: Recent Labs  Lab 10/02/21 0503 10/02/21 1332 10/03/21 0403  NA 143  --  142  K 3.7  --  4.0  CL 110  --  114*  CO2 24  --  23  GLUCOSE 125*  --  115*  BUN 20  --  18  CREATININE 1.20* 1.14* 1.20*  CALCIUM 8.6*  --  7.8*  MG 2.1  --   --    Liver Function Tests: Recent Labs  Lab 10/02/21 0503  AST 23  ALT 18  ALKPHOS 90  BILITOT 0.6  PROT 6.5  ALBUMIN 3.5   BNP (last 3 results) Recent Labs    10/02/21 1332  BNP 118.0*   Cardiac Enzymes: No results for input(s): CKTOTAL, CKMB, CKMBINDEX, TROPONINI in the last 168 hours. CBG: Recent Labs  Lab 10/02/21 1746 10/03/21 0121 10/03/21 0318 10/03/21 0729 10/03/21 1154  GLUCAP 123* 79 107* 84 169*   Hgb A1c Recent Labs    10/02/21 1332  HGBA1C 8.9*   Lipid Profile No results for input(s): CHOL, HDL, LDLCALC, TRIG, CHOLHDL, LDLDIRECT in the last 72 hours. Thyroid function studies Recent Labs    10/02/21 0827 10/02/21 1332  TSH 8.843*  --   T3FREE  --  2.0   Anemia work up No results for input(s): VITAMINB12, FOLATE, FERRITIN, TIBC, IRON, RETICCTPCT in the last 72 hours. Urinalysis    Component Value Date/Time   COLORURINE YELLOW 10/02/2021 1029   APPEARANCEUR CLEAR 10/02/2021 1029   LABSPEC >1.030 (H) 10/02/2021 1029   PHURINE 6.0 10/02/2021 1029   GLUCOSEU NEGATIVE 10/02/2021 1029   HGBUR MODERATE (A) 10/02/2021 1029   BILIRUBINUR NEGATIVE 10/02/2021 1029   KETONESUR NEGATIVE 10/02/2021 1029   PROTEINUR >300 (A) 10/02/2021 1029    UROBILINOGEN 0.2 09/06/2013 0400   NITRITE NEGATIVE 10/02/2021 1029   LEUKOCYTESUR NEGATIVE 10/02/2021 1029         Time coordinating discharge: Over 45 minutes  SIGNED: Deatra James, MD, FACP, FHM. [facility],  Please use [email or web address] to Page If 7PM-7AM, please contact night-coverage [email or web address],  10/03/2021, 4:24 PM

## 2021-12-05 ENCOUNTER — Encounter (HOSPITAL_BASED_OUTPATIENT_CLINIC_OR_DEPARTMENT_OTHER): Payer: Self-pay

## 2021-12-05 ENCOUNTER — Emergency Department (HOSPITAL_BASED_OUTPATIENT_CLINIC_OR_DEPARTMENT_OTHER)
Admission: EM | Admit: 2021-12-05 | Discharge: 2021-12-05 | Disposition: A | Discharge status: Home / Self Care | Payer: BLUE CROSS/BLUE SHIELD | Attending: Emergency Medicine | Admitting: Emergency Medicine

## 2021-12-05 ENCOUNTER — Other Ambulatory Visit: Payer: Self-pay

## 2021-12-05 DIAGNOSIS — Z794 Long term (current) use of insulin: Secondary | ICD-10-CM | POA: Diagnosis not present

## 2021-12-05 DIAGNOSIS — G43809 Other migraine, not intractable, without status migrainosus: Secondary | ICD-10-CM | POA: Insufficient documentation

## 2021-12-05 DIAGNOSIS — Z79899 Other long term (current) drug therapy: Secondary | ICD-10-CM | POA: Insufficient documentation

## 2021-12-05 DIAGNOSIS — R519 Headache, unspecified: Secondary | ICD-10-CM | POA: Diagnosis present

## 2021-12-05 DIAGNOSIS — E119 Type 2 diabetes mellitus without complications: Secondary | ICD-10-CM | POA: Diagnosis not present

## 2021-12-05 LAB — CBG MONITORING, ED: Glucose-Capillary: 100 mg/dL — ABNORMAL HIGH (ref 70–99)

## 2021-12-05 MED ORDER — KETOROLAC TROMETHAMINE 30 MG/ML IJ SOLN
30.0000 mg | Freq: Once | INTRAMUSCULAR | Status: AC
  Administered 2021-12-05: 30 mg via INTRAVENOUS
  Filled 2021-12-05: qty 1

## 2021-12-05 MED ORDER — PROCHLORPERAZINE EDISYLATE 10 MG/2ML IJ SOLN
10.0000 mg | Freq: Once | INTRAMUSCULAR | Status: AC
  Administered 2021-12-05: 10 mg via INTRAVENOUS
  Filled 2021-12-05: qty 2

## 2021-12-05 MED ORDER — DIPHENHYDRAMINE HCL 50 MG/ML IJ SOLN
25.0000 mg | Freq: Once | INTRAMUSCULAR | Status: AC
  Administered 2021-12-05: 25 mg via INTRAVENOUS
  Filled 2021-12-05: qty 1

## 2021-12-05 MED ORDER — DIPHENHYDRAMINE HCL 25 MG PO CAPS: 25.0000 mg | ORAL_CAPSULE | Freq: Once | ORAL | Status: DC

## 2021-12-05 MED ORDER — SODIUM CHLORIDE 0.9 % IV BOLUS
1000.0000 mL | Freq: Once | INTRAVENOUS | Status: AC
  Administered 2021-12-05: 1000 mL via INTRAVENOUS

## 2021-12-05 NOTE — ED Notes (Signed)
Pt verbalizes understanding of discharge instructions. Opportunity for questioning and answers were provided. Pt discharged from ED to home.   ? ?

## 2021-12-05 NOTE — ED Provider Notes (Signed)
[facility] EMERGENCY DEPT Provider Note   CSN: [id number] Arrival date & time: 12/05/21  1404     History  Chief Complaint  Patient presents with   Headache    Elizabeth Jordan is a 56 y.o. female.  The history is provided by the patient.  Headache Pain location:  Generalized Radiates to:  Does not radiate Severity currently:  7/10 Onset quality:  Gradual Duration:  1 day Timing:  Intermittent Progression:  Waxing and waning Chronicity:  Recurrent Similar to prior headaches: yes   Context: bright light   Worsened by:  Nothing Ineffective treatments:  None tried Associated symptoms: no abdominal pain, no back pain, no blurred vision, no congestion, no cough, no diarrhea, no dizziness, no drainage, no ear pain, no eye pain, no facial pain, no fatigue, no fever, no focal weakness, no hearing loss, no loss of balance, no myalgias, no nausea, no near-syncope, no neck pain, no neck stiffness, no numbness, no paresthesias, no photophobia, no seizures, no sinus pressure, no sore throat, no swollen glands, no syncope, no tingling, no URI, no visual change, no vomiting and no weakness       Home Medications Prior to Admission medications   Medication Sig Start Date End Date Taking? Authorizing Provider  atorvastatin (LIPITOR) 10 MG tablet Take 1 tablet (10 mg total) by mouth every evening. 10/03/21 11/02/21  Shahmehdi, Gemma Payor, MD  HUMALOG KWIKPEN 100 UNIT/ML KwikPen Inject 10-15 Units into the skin 4 (four) times daily. 07/11/21   [provider]  levothyroxine (SYNTHROID) 150 MCG tablet Take 150 mcg by mouth daily. 09/24/21   [provider]  lisinopril (PRINIVIL,ZESTRIL) 2.5 MG tablet Take 2.5 mg by mouth daily. 12/06/15   [provider]      Allergies    Patient has no known allergies.    Review of Systems   Review of Systems  Constitutional:  Negative for fatigue and fever.  HENT:  Negative for congestion, ear pain, hearing loss,  postnasal drip, sinus pressure and sore throat.   Eyes:  Negative for blurred vision, photophobia and pain.  Respiratory:  Negative for cough.   Cardiovascular:  Negative for syncope and near-syncope.  Gastrointestinal:  Negative for abdominal pain, diarrhea, nausea and vomiting.  Musculoskeletal:  Negative for back pain, myalgias, neck pain and neck stiffness.  Neurological:  Positive for headaches. Negative for dizziness, focal weakness, seizures, weakness, numbness, paresthesias and loss of balance.   Physical Exam Updated Vital Signs BP (!) 165/72    Pulse 96    Temp 98.2 F (36.8 C) (Temporal)    Resp 12    Ht 5\' 8"  (1.727 m)    Wt 114 kg    LMP 12/28/2011    SpO2 96%    BMI 38.21 kg/m  Physical Exam Vitals and nursing note reviewed.  Constitutional:      General: She is not in acute distress.    Appearance: She is well-developed. She is not ill-appearing.  HENT:     Head: Normocephalic and atraumatic.     Mouth/Throat:     Mouth: Mucous membranes are moist.  Eyes:     General: No visual field deficit.    Extraocular Movements: Extraocular movements intact.     Right eye: Normal extraocular motion and no nystagmus.     Left eye: Normal extraocular motion and no nystagmus.     Conjunctiva/sclera: Conjunctivae normal.     Pupils: Pupils are equal, round, and reactive to light.  Right eye: Pupil is reactive.     Left eye: Pupil is reactive.  Cardiovascular:     Rate and Rhythm: Normal rate and regular rhythm.     Heart sounds: Normal heart sounds. No murmur heard. Pulmonary:     Effort: Pulmonary effort is normal. No respiratory distress.     Breath sounds: Normal breath sounds.  Abdominal:     Palpations: Abdomen is soft.     Tenderness: There is no abdominal tenderness.  Musculoskeletal:        General: No swelling.     Cervical back: Normal range of motion and neck supple.  Skin:    General: Skin is warm and dry.     Capillary Refill: Capillary refill takes less  than 2 seconds.  Neurological:     Mental Status: She is alert and oriented to person, place, and time.     Cranial Nerves: No cranial nerve deficit, dysarthria or facial asymmetry.     Sensory: No sensory deficit.     Motor: No weakness.     Coordination: Coordination normal.  Psychiatric:        Mood and Affect: Mood normal.        Speech: Speech normal.    ED Results / Procedures / Treatments   Labs (all labs ordered are listed, but only abnormal results are displayed) Labs Reviewed  CBG MONITORING, ED - Abnormal; Notable for the following components:      Result Value   Glucose-Capillary 100 (*)    All other components within normal limits    EKG None  Radiology No results found.  Procedures Procedures    Medications Ordered in ED Medications  diphenhydrAMINE (BENADRYL) capsule 25 mg (has no administration in time range)  sodium chloride 0.9 % bolus 1,000 mL (1,000 mLs Intravenous New Bag/Given 12/05/21 1728)  prochlorperazine (COMPAZINE) injection 10 mg (10 mg Intravenous Given 12/05/21 1752)  diphenhydrAMINE (BENADRYL) injection 25 mg (25 mg Intravenous Given 12/05/21 1750)  ketorolac (TORADOL) 30 MG/ML injection 30 mg (30 mg Intravenous Given 12/05/21 1729)    ED Course/ Medical Decision Making/ A&P                           Medical Decision Making Risk Prescription drug management.   Elizabeth Jordan is here with headache.  Patient with diabetes, high cholesterol.  Acute on chronic headache today.  Has been having more frequent headaches over the last several months.  Per chart review she has had extensive work-up with EEG and MRI of her brain.  MRI showed no mass.  She arrives with overall unremarkable vitals.  Slightly worse than her recent normal headaches over the last several months.  She is neurologically intact.  She denies any weakness or numbness.  Differential diagnosis includes headache from either migraine or tension.  Could be stress related as well.  I  have no concern for stroke or other intracranial process given history and physical.  She has had reassuring imaging recently as well.  Patient was treated with headache cocktail with IV Compazine, IV Benadryl, IV Toradol and IV fluids with improvement.  Given the frequency of her headaches we will have her follow-up with neurology for further care.  Discharged in good condition.  Understands return precautions.  This chart was dictated using voice recognition software.  Despite best efforts to proofread,  errors can occur which can change the documentation meaning.  Final Clinical Impression(s) / ED Diagnoses Final diagnoses:  Other migraine without status migrainosus, not intractable    Rx / DC Orders ED Discharge Orders     None         Virgina Norfolk, DO 12/05/21 1904

## 2021-12-05 NOTE — ED Triage Notes (Signed)
Patient here POV from Home with Headache.  Patient endorses Recent History of Headaches Intermittently since December. PCP increased Lisinopril yesterday.   Headache has been present since this AM and remained constant since.   Moderate Nausea. No Intake this AM due to Emesis. No Diarrhea. Photosensitive. 4 mg Zofran this AM.  NAD Noted during Triage. A&Ox4. GCS 15. Ambulatory

## 2023-02-16 IMAGING — MR MR HEAD W/O CM
6 series · 48 of 48 positions shown · non-contrast
Comparison: CT head 10/02/2021

CLINICAL DATA: TIA

EXAM:
MRI HEAD WITHOUT CONTRAST
TECHNIQUE: Multiplanar, multiecho pulse sequences of the brain and surrounding
structures were obtained without intravenous contrast.

[Series 9: DWI · axial · 4.0mm · 0.88mm/px · z∈[-51,+95]mm · 10 of 38 slices shown (1 of 4)]
[im 1/38]
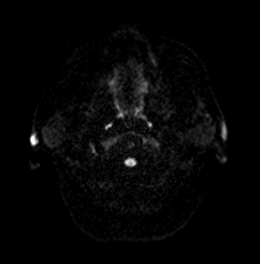
[im 5/38]
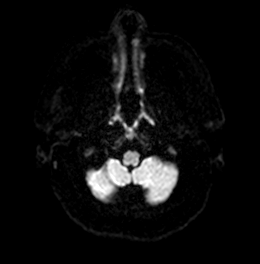
[im 9/38]
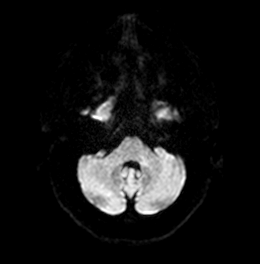
[im 13/38]
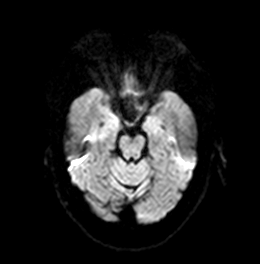
[im 17/38]
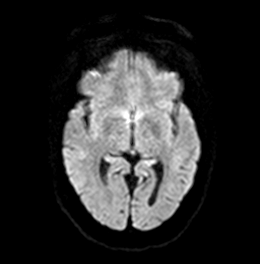
[im 21/38]
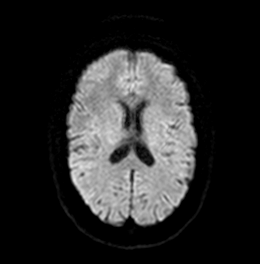
[im 25/38]
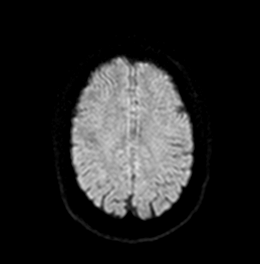
[im 29/38]
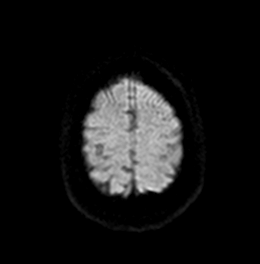
[im 33/38]
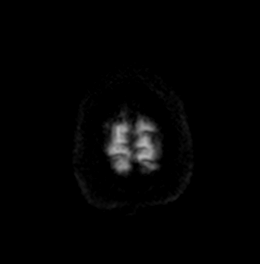
[im 38/38]
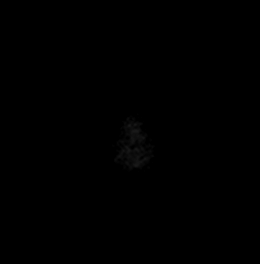

[Series 10: DWI · axial · 4.0mm · 0.88mm/px · z∈[-51,+95]mm · 10 of 38 slices shown (2 of 4)]
[im 1/38]
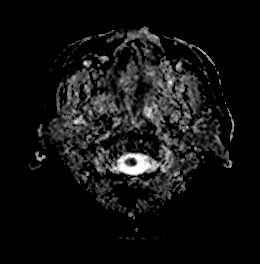
[im 5/38]
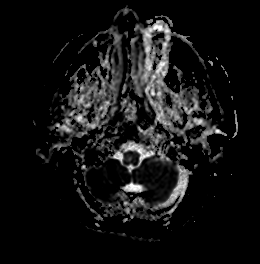
[im 9/38]
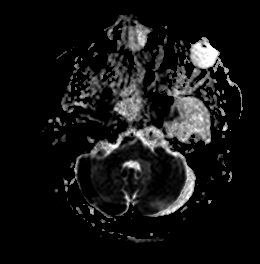
[im 13/38]
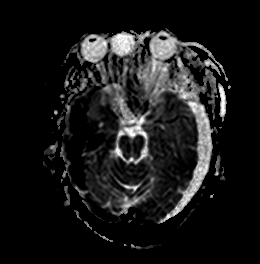
[im 17/38]
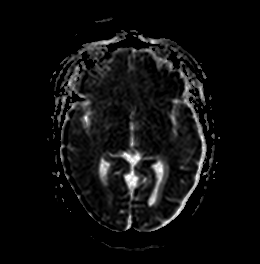
[im 21/38]
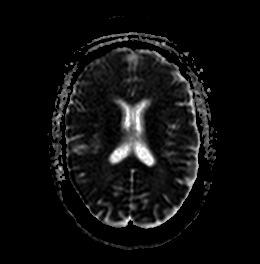
[im 25/38]
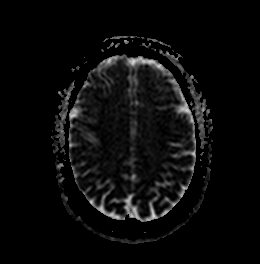
[im 29/38]
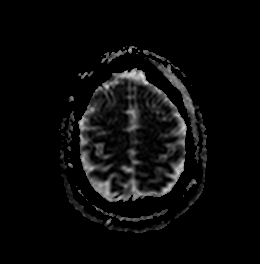
[im 33/38]
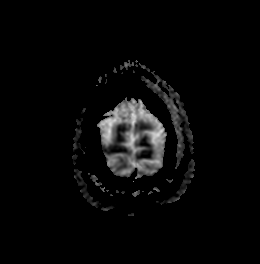
[im 38/38]
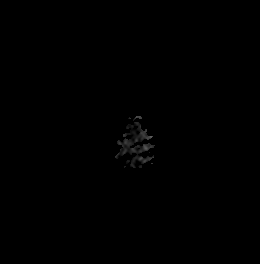

[Series 11: DWI · coronal · 5.0mm · 0.88mm/px · 8 of 32 slices shown (3 of 4)]
[im 1/32]
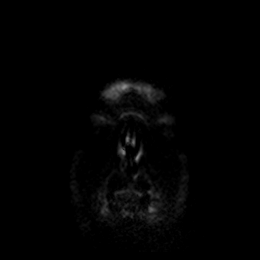
[im 5/32]
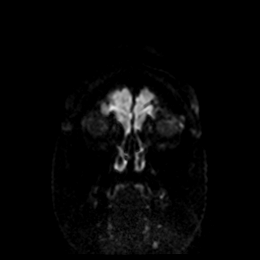
[im 9/32]
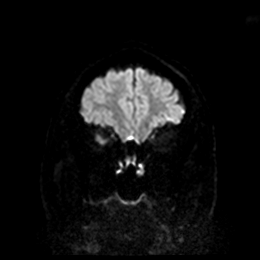
[im 14/32]
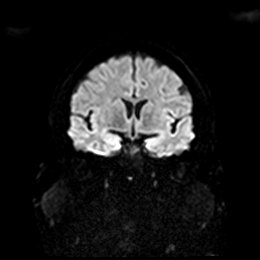
[im 18/32]
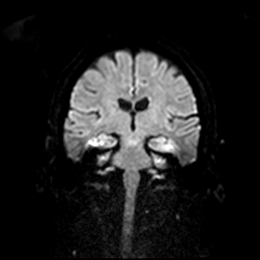
[im 23/32]
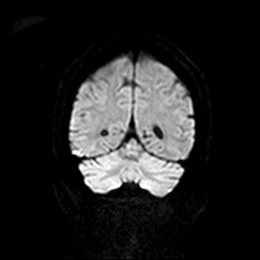
[im 27/32]
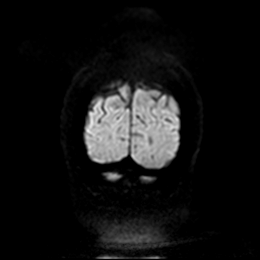
[im 32/32]
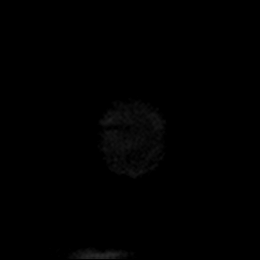

[Series 12: DWI · coronal · 5.0mm · 0.88mm/px · 8 of 32 slices shown (4 of 4)]
[im 1/32]
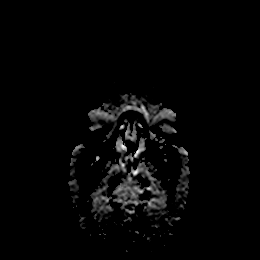
[im 5/32]
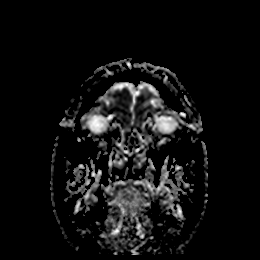
[im 9/32]
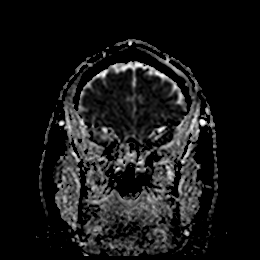
[im 14/32]
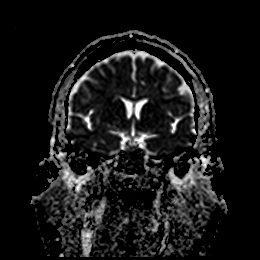
[im 18/32]
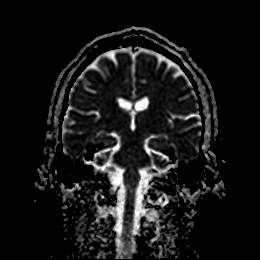
[im 23/32]
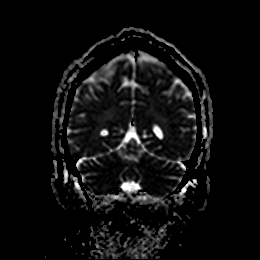
[im 27/32]
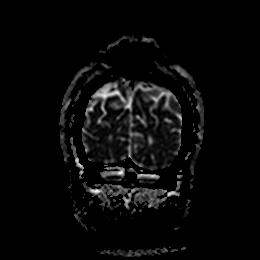
[im 32/32]
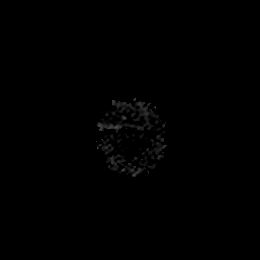

[Series 13: T1 · sagittal · 5.0mm · 0.75mm/px · 6 of 21 slices shown]
[im 1/21]
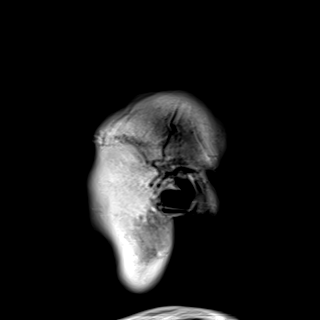
[im 5/21]
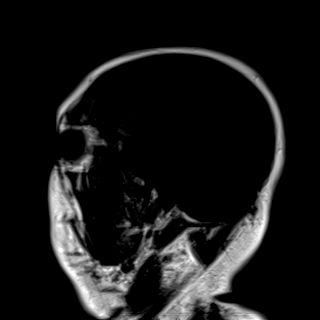
[im 9/21]
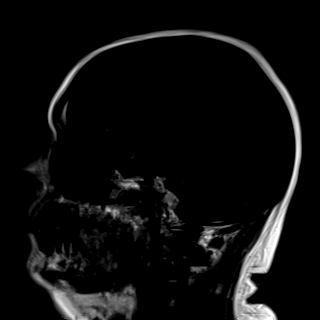
[im 13/21]
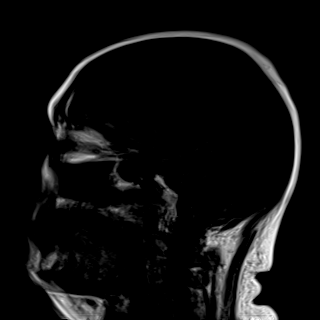
[im 17/21]
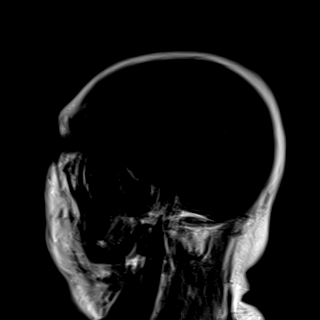
[im 21/21]
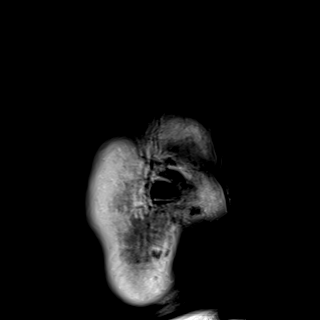

[Series 14: T2 · axial · 5.0mm · 0.72mm/px · z∈[-47,+98]mm · 6 of 22 slices shown]
[im 1/22]
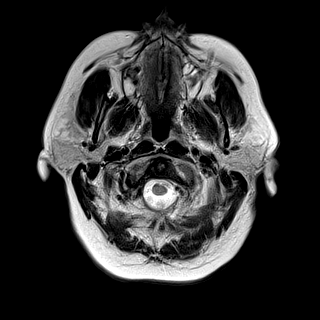
[im 5/22]
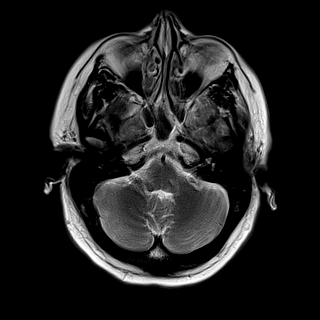
[im 9/22]
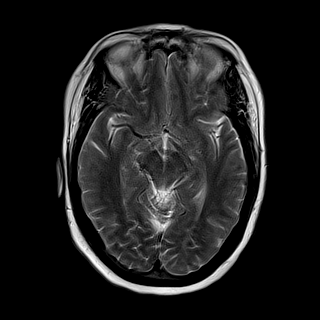
[im 13/22]
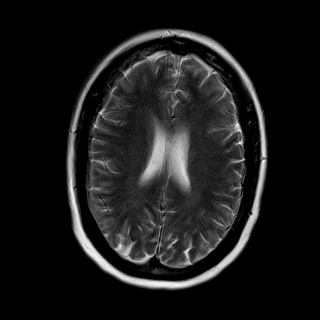
[im 17/22]
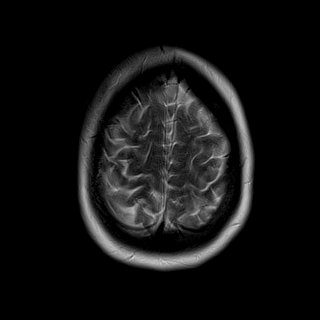
[im 22/22]
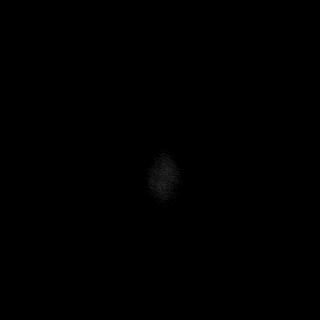

[48 of 48 positions shown; findings below may reference images not displayed]

FINDINGS: Brain: Limited study. The patient was not able to complete the
study. Available images are degraded by motion.

Negative for acute infarct. No significant chronic ischemia.
Ventricle size normal. No fluid collection or mass.

Vascular: Normal arterial flow voids at the skull base.

Skull and upper cervical spine: No focal abnormality.

Sinuses/Orbits: Mild mucosal edema paranasal sinuses. Negative orbit

Other: None
IMPRESSION: Limited study.  No acute abnormality.

## 2023-02-16 IMAGING — CT CT HEAD W/O CM
3 series · 16 of 47 positions shown, 19 images · non-contrast
Comparison: None.

CLINICAL DATA: Mental status change, unknown cause

EXAM:
CT HEAD WITHOUT CONTRAST
TECHNIQUE: Contiguous axial images were obtained from the base of the skull
through the vertex without intravenous contrast.

[Series 2: head w o · axial · 0.47mm/px · z∈[+68,+193]mm · 10 of 31 slices shown, 13 images]
[im 3/31  brain]
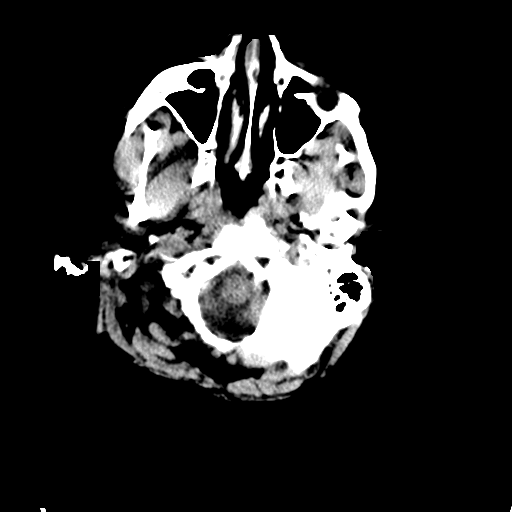
[im 3/31  bone]
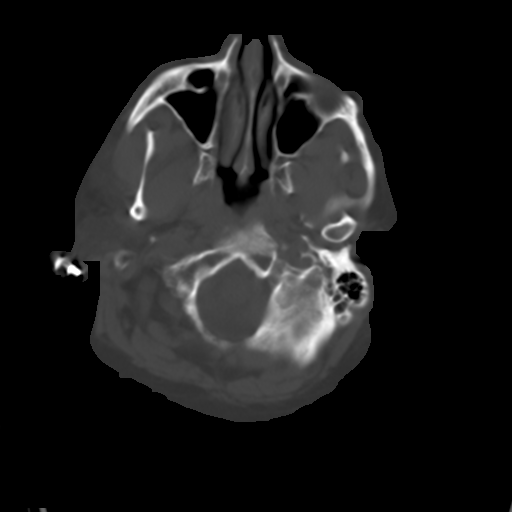
[im 6/31  brain]
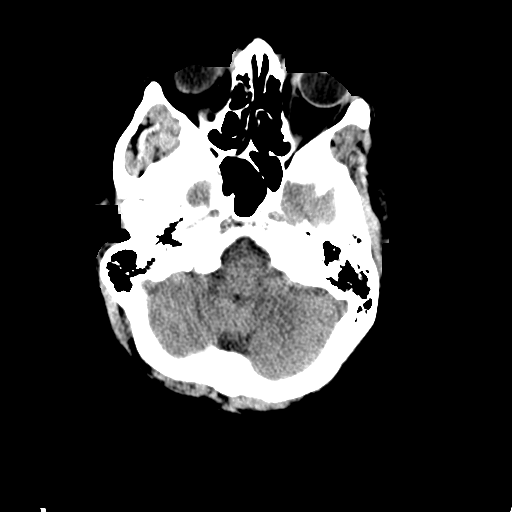
[im 9/31  brain]
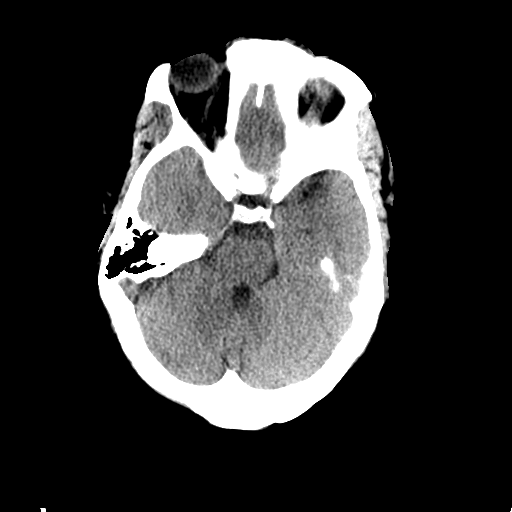
[im 11/31  brain]
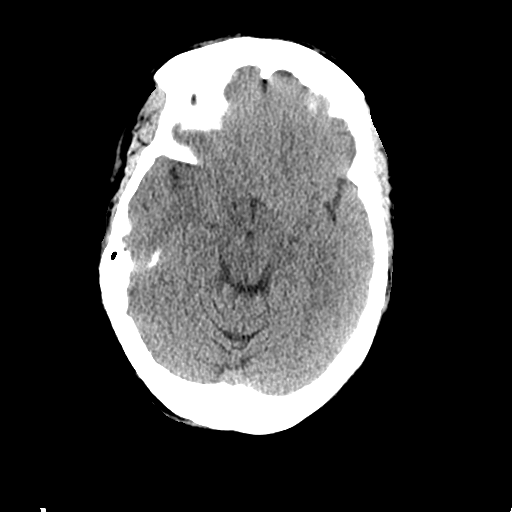
[im 14/31  brain]
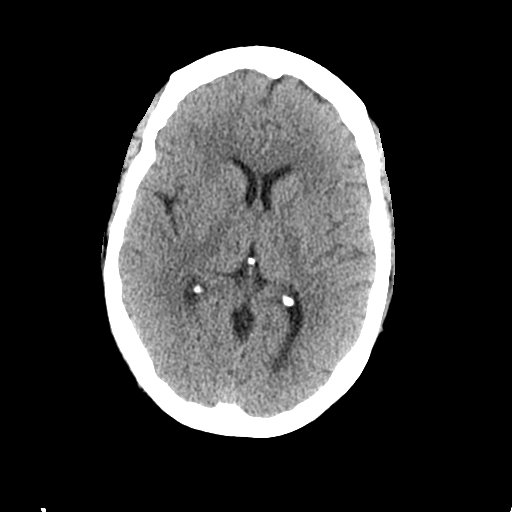
[im 14/31  bone]
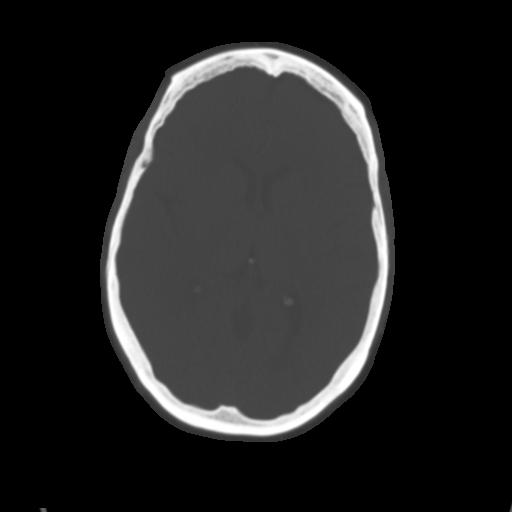
[im 17/31  brain]
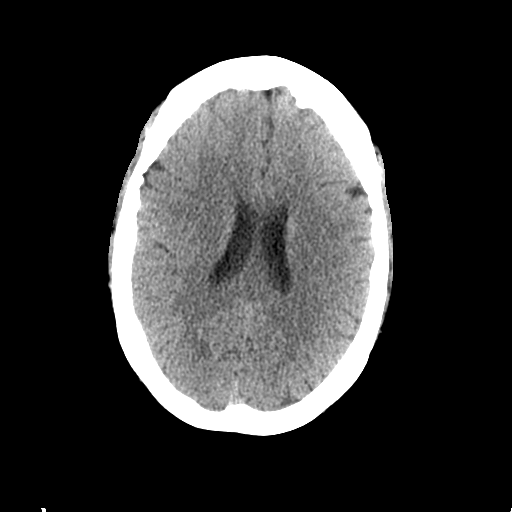
[im 20/31  brain]
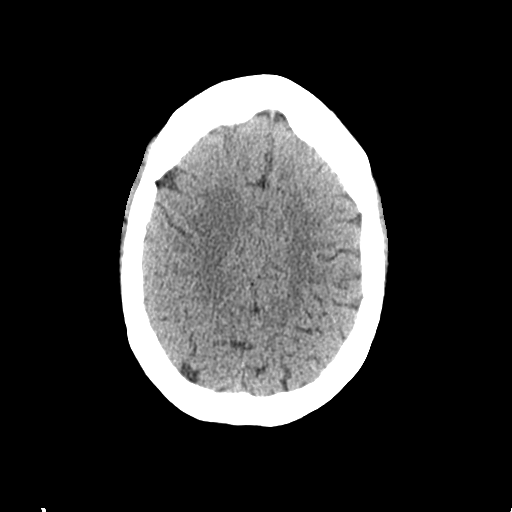
[im 23/31  brain]
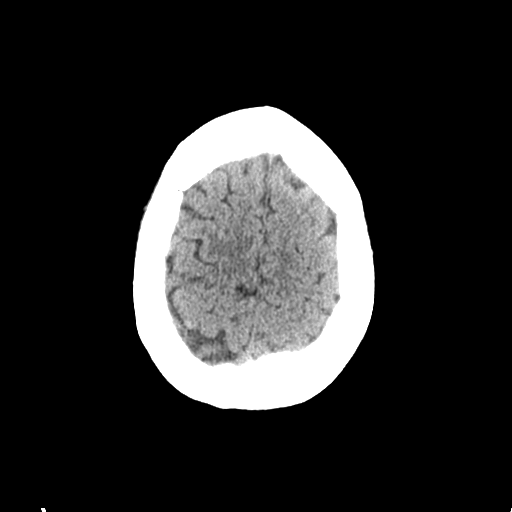
[im 25/31  brain]
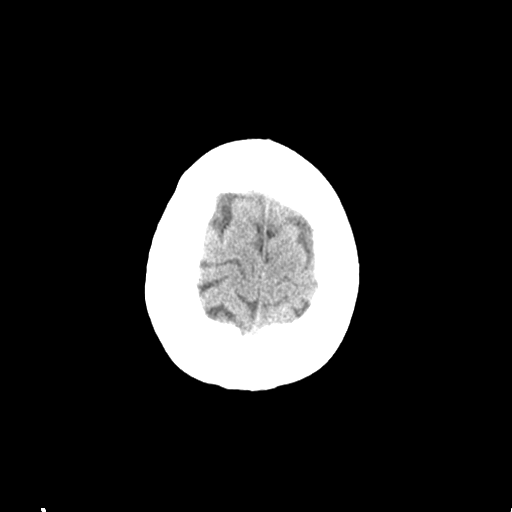
[im 25/31  bone]
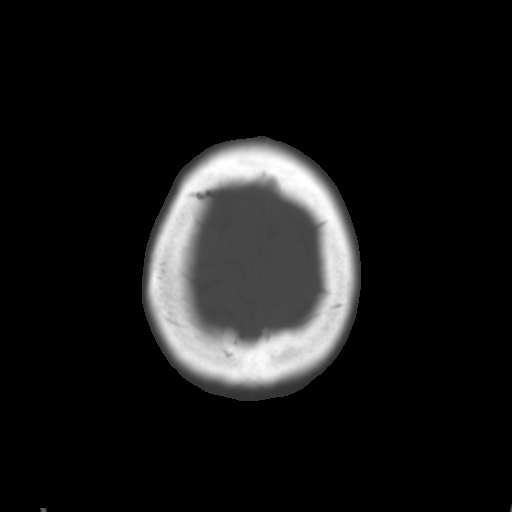
[im 28/31  brain]
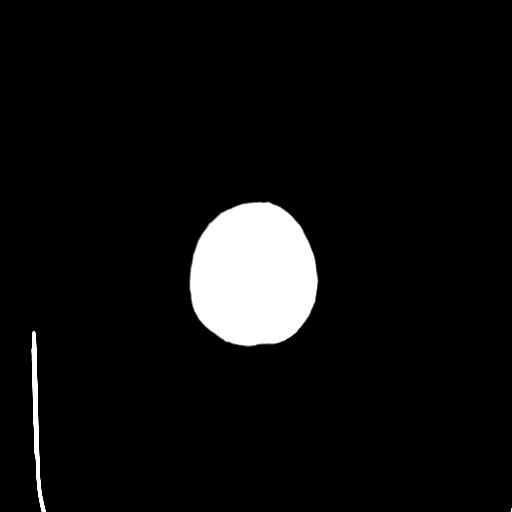

[Series 4: coronal soft · coronal · 0.32mm/px · 3 of 71 slices shown]
[im 24/71  brain]
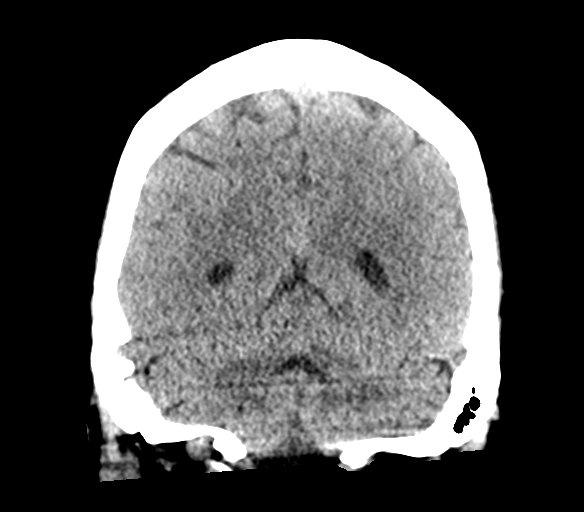
[im 32/71  brain]
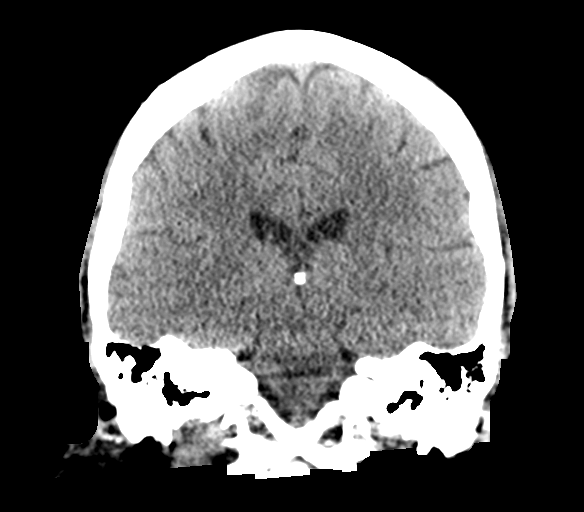
[im 39/71  brain]
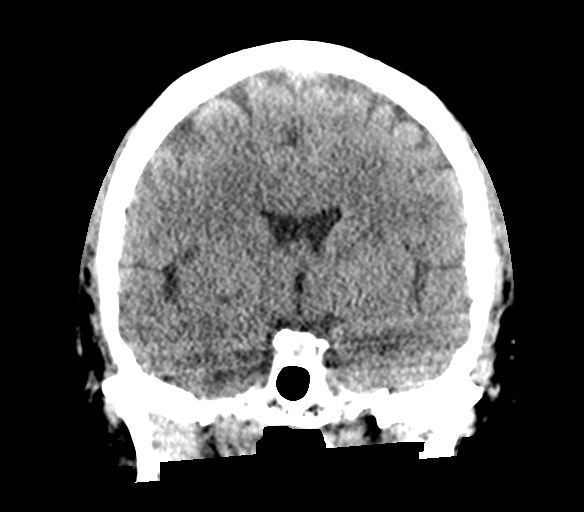

[Series 5: sagittal soft · sagittal · 0.32mm/px · 3 of 63 slices shown]
[im 21/63  brain]
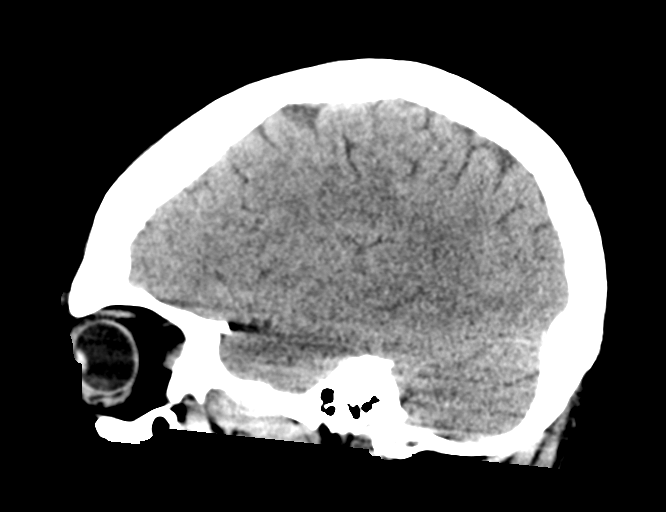
[im 32/63  brain]
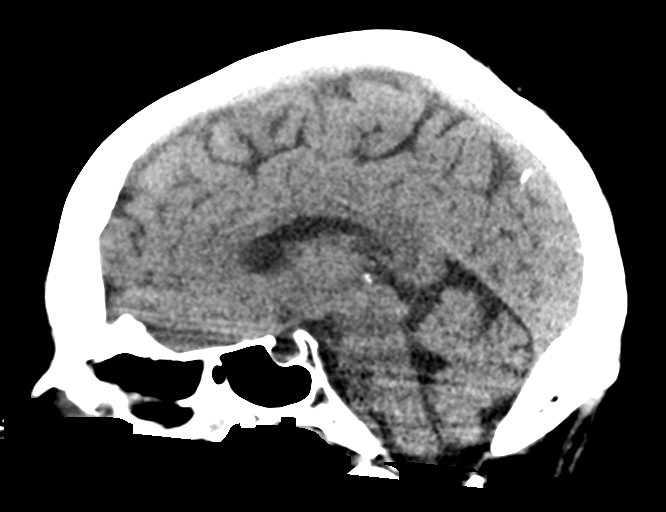
[im 42/63  brain]
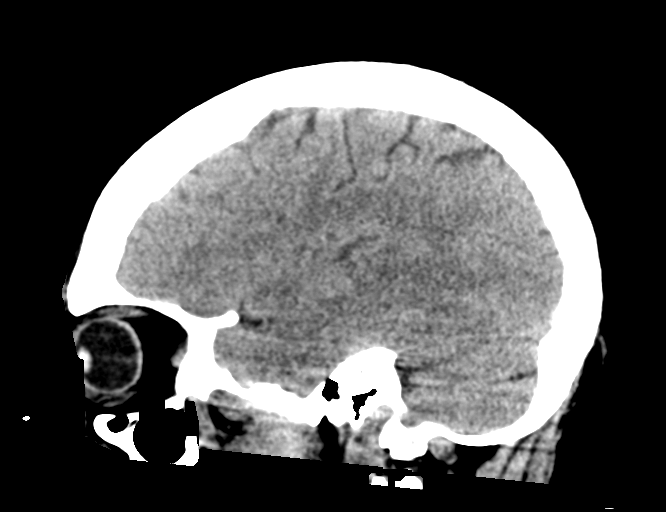

[16 of 47 positions shown; findings below may reference images not displayed]

FINDINGS: Brain: No evidence of acute infarction, hemorrhage, hydrocephalus,
extra-axial collection or mass lesion/mass effect.

Vascular: No hyperdense vessel or unexpected calcification.

Skull: No osseous abnormality.

Sinuses/Orbits: Visualized paranasal sinuses are clear. Visualized
mastoid sinuses are clear. Visualized orbits demonstrate no focal
abnormality.

Other: None
IMPRESSION: No acute intracranial pathology.
